# Patient Record
Sex: Male | Born: 1965 | Race: Black or African American | Hispanic: No | Marital: Single | Smoking: Current some day smoker
Health system: Southern US, Community
[De-identification: ages and names within clinical notes are randomized; demographics above are authoritative.]

## PROBLEM LIST (undated history)

## (undated) DIAGNOSIS — B1920 Unspecified viral hepatitis C without hepatic coma: Secondary | ICD-10-CM

## (undated) DIAGNOSIS — I10 Essential (primary) hypertension: Secondary | ICD-10-CM

## (undated) DIAGNOSIS — I729 Aneurysm of unspecified site: Secondary | ICD-10-CM

## (undated) DIAGNOSIS — E119 Type 2 diabetes mellitus without complications: Secondary | ICD-10-CM

---

## 2006-05-09 ENCOUNTER — Emergency Department: Payer: Self-pay | Admitting: Emergency Medicine

## 2006-05-19 ENCOUNTER — Emergency Department: Payer: Self-pay | Admitting: Emergency Medicine

## 2015-07-06 ENCOUNTER — Emergency Department
Admission: EM | Admit: 2015-07-06 | Discharge: 2015-07-06 | Discharge status: Absent without leave | Payer: Self-pay | Source: Home / Self Care

## 2016-02-26 ENCOUNTER — Emergency Department (HOSPITAL_COMMUNITY)
Admission: EM | Admit: 2016-02-26 | Discharge: 2016-02-26 | Disposition: A | Discharge status: Home / Self Care | Payer: Self-pay | Attending: Emergency Medicine | Admitting: Emergency Medicine

## 2016-02-26 ENCOUNTER — Encounter (HOSPITAL_COMMUNITY): Payer: Self-pay | Admitting: Emergency Medicine

## 2016-02-26 ENCOUNTER — Emergency Department (HOSPITAL_COMMUNITY): Payer: Self-pay

## 2016-02-26 DIAGNOSIS — F17210 Nicotine dependence, cigarettes, uncomplicated: Secondary | ICD-10-CM | POA: Insufficient documentation

## 2016-02-26 DIAGNOSIS — Z23 Encounter for immunization: Secondary | ICD-10-CM | POA: Insufficient documentation

## 2016-02-26 DIAGNOSIS — E119 Type 2 diabetes mellitus without complications: Secondary | ICD-10-CM | POA: Insufficient documentation

## 2016-02-26 DIAGNOSIS — S82041A Displaced comminuted fracture of right patella, initial encounter for closed fracture: Secondary | ICD-10-CM | POA: Insufficient documentation

## 2016-02-26 DIAGNOSIS — S82402A Unspecified fracture of shaft of left fibula, initial encounter for closed fracture: Secondary | ICD-10-CM

## 2016-02-26 DIAGNOSIS — S82432A Displaced oblique fracture of shaft of left fibula, initial encounter for closed fracture: Secondary | ICD-10-CM | POA: Insufficient documentation

## 2016-02-26 DIAGNOSIS — W1789XA Other fall from one level to another, initial encounter: Secondary | ICD-10-CM | POA: Insufficient documentation

## 2016-02-26 DIAGNOSIS — S82001A Unspecified fracture of right patella, initial encounter for closed fracture: Secondary | ICD-10-CM

## 2016-02-26 DIAGNOSIS — Z8619 Personal history of other infectious and parasitic diseases: Secondary | ICD-10-CM | POA: Insufficient documentation

## 2016-02-26 DIAGNOSIS — Z79899 Other long term (current) drug therapy: Secondary | ICD-10-CM | POA: Insufficient documentation

## 2016-02-26 DIAGNOSIS — Y9389 Activity, other specified: Secondary | ICD-10-CM | POA: Insufficient documentation

## 2016-02-26 DIAGNOSIS — Y998 Other external cause status: Secondary | ICD-10-CM | POA: Insufficient documentation

## 2016-02-26 DIAGNOSIS — S80212A Abrasion, left knee, initial encounter: Secondary | ICD-10-CM | POA: Insufficient documentation

## 2016-02-26 DIAGNOSIS — Z7984 Long term (current) use of oral hypoglycemic drugs: Secondary | ICD-10-CM | POA: Insufficient documentation

## 2016-02-26 DIAGNOSIS — I10 Essential (primary) hypertension: Secondary | ICD-10-CM | POA: Insufficient documentation

## 2016-02-26 DIAGNOSIS — Y92009 Unspecified place in unspecified non-institutional (private) residence as the place of occurrence of the external cause: Secondary | ICD-10-CM | POA: Insufficient documentation

## 2016-02-26 HISTORY — DX: Aneurysm of unspecified site: I72.9

## 2016-02-26 HISTORY — DX: Essential (primary) hypertension: I10

## 2016-02-26 HISTORY — DX: Type 2 diabetes mellitus without complications: E11.9

## 2016-02-26 HISTORY — DX: Unspecified viral hepatitis C without hepatic coma: B19.20

## 2016-02-26 MED ORDER — BACITRACIN ZINC 500 UNIT/GM EX OINT
TOPICAL_OINTMENT | Freq: Two times a day (BID) | CUTANEOUS | Status: DC
  Administered 2016-02-26: 1 via TOPICAL
  Filled 2016-02-26: qty 0.9

## 2016-02-26 MED ORDER — OXYCODONE-ACETAMINOPHEN 5-325 MG PO TABS: 1.0000 | ORAL_TABLET | Freq: Four times a day (QID) | ORAL | Status: DC | PRN

## 2016-02-26 MED ORDER — FENTANYL CITRATE (PF) 100 MCG/2ML IJ SOLN
50.0000 ug | Freq: Once | INTRAMUSCULAR | Status: AC
  Administered 2016-02-26: 50 ug via INTRAVENOUS
  Filled 2016-02-26: qty 2

## 2016-02-26 MED ORDER — TETANUS-DIPHTH-ACELL PERTUSSIS 5-2.5-18.5 LF-MCG/0.5 IM SUSP
0.5000 mL | Freq: Once | INTRAMUSCULAR | Status: AC
  Administered 2016-02-26: 0.5 mL via INTRAMUSCULAR
  Filled 2016-02-26: qty 0.5

## 2016-02-26 MED ORDER — KETOROLAC TROMETHAMINE 30 MG/ML IJ SOLN
30.0000 mg | Freq: Once | INTRAMUSCULAR | Status: AC
  Administered 2016-02-26: 30 mg via INTRAVENOUS
  Filled 2016-02-26: qty 1

## 2016-02-26 NOTE — ED Provider Notes (Signed)
CSN: [id number]     Arrival date & time 02/26/16  0044 History  By signing my name below, I, Max Brown, attest that this documentation has been prepared under the direction and in the presence of Max Lenderman, MD. Electronically Signed: Bethel BornBritney Brown, ED Scribe. 02/26/2016. 3:34 AM   Chief Complaint  Patient presents with  . Fall    Patient is a 50 y.o. male presenting with fall. The history is provided by the patient. No language interpreter was used.  Fall This is a new problem. The current episode started less than 1 hour ago. The problem occurs constantly. The problem has not changed since onset.Pertinent negatives include no chest pain, no abdominal pain, no headaches and no shortness of breath. Nothing aggravates the symptoms. Nothing relieves the symptoms. He has tried nothing for the symptoms.   Brought in by EMS, Max Brown is a 50 y.o. male who presents to the Emergency Department complaining of a fall at home just PTA. Pt states that he was sitting in a chair and fell 15 feet out of his open bedroom window. He landed on his feet and then his right knee. Associated symptoms include right knee pain and abrasions. Pt denies head injury, LOC, back pain, right hip pain, and left lower extremity pain. He was given fentanyl by EMS en route to the hospital. Last tetanus is unknown.  Past Medical History  Diagnosis Date  . Hepatitis C     pt states in 2009 diagnosed and received some meds for it, but has not gotten anymore  . Diabetes mellitus without complication (HCC)   . Hypertension   . Aneurysm (HCC)     left side of brain, 1997   History reviewed. No pertinent past surgical history. History reviewed. No pertinent family history. Social History  Substance Use Topics  . Smoking status: Current Every Day Smoker -- 0.20 packs/day for 30 years    Types: Cigarettes  . Smokeless tobacco: Never Used  . Alcohol Use: No    Review of Systems  Respiratory: Negative for  shortness of breath.   Cardiovascular: Negative for chest pain.  Gastrointestinal: Negative for abdominal pain.  Musculoskeletal: Negative for back pain and neck pain.       Right knee pain  Skin: Positive for wound (right knee).  Neurological: Negative for syncope and headaches.  All other systems reviewed and are negative.  Allergies  Review of patient's allergies indicates no known allergies.  Home Medications   Prior to Admission medications   Medication Sig Start Date End Date Taking? Authorizing Provider  atorvastatin (LIPITOR) 10 MG tablet Take 10 mg by mouth daily. 12/21/15  Yes Historical Provider, MD  metFORMIN (GLUCOPHAGE) 1000 MG tablet Take 1,000 mg by mouth 2 (two) times daily. 12/21/15  Yes Historical Provider, MD   BP 116/71 mmHg  Pulse 89  Temp(Src) 98.4 F (36.9 C) (Oral)  Resp 20  Ht 5\' 9"  (1.753 m)  Wt 186 lb (84.369 kg)  BMI 27.45 kg/m2  SpO2 98% Physical Exam  Constitutional: He is oriented to person, place, and time. He appears well-developed and well-nourished.  HENT:  Head: Normocephalic and atraumatic.  Mouth/Throat: Oropharynx is clear and moist.  Moist mucous membranes No exudate No battle sign No racoon eyes No hemotympanum bilaterally  Eyes: EOM are normal. Pupils are equal, round, and reactive to light.  Neck: Normal range of motion. Neck supple.  Trachea midline  Cardiovascular: Normal rate, regular rhythm and intact distal pulses.  Pulses:      Dorsalis pedis pulses are 3+ on the right side.       Posterior tibial pulses are 3+ on the right side.  Pulmonary/Chest: Effort normal and breath sounds normal. No stridor. He has no wheezes. He has no rales.  Abdominal: Soft. Bowel sounds are normal. He exhibits no mass. There is no tenderness. There is no rebound and no guarding.  Musculoskeletal:       Right wrist: Normal.       Left wrist: Normal.       Right hip: Normal.       Left hip: Normal.       Right knee: He exhibits decreased range  of motion and bony tenderness. He exhibits no laceration, normal alignment, normal meniscus and no MCL laxity. No tenderness found. No medial joint line, no lateral joint line, no MCL, no LCL and no patellar tendon tenderness noted.       Left ankle: Normal. Achilles tendon normal.  2 abrasions: 1X 1 cm in diameter lateral abrasion, second abrasion is linear and more diagonal Capillary refill less than 2 seconds Negative anterior/ posterior drawer test No patella [place] or [place] No laxity to varus or valgus stress  Neurological: He is alert and oriented to person, place, and time.  Achilles tendon intact  Skin: Skin is warm and dry.  Psychiatric: He has a normal mood and affect. His behavior is normal.  Nursing note and vitals reviewed.   ED Course  Procedures (including critical care time) DIAGNOSTIC STUDIES: Oxygen Saturation is 98% on RA,  normal by my interpretation.    COORDINATION OF CARE: 12:59 AM Discussed treatment plan which includes XRs of the right knee and hip with pt at bedside and pt agreed to plan.  1:09 AM Pt now complaining of left ankle pain and admitting to using crack cocaine prior to the fall. Plan for left ankle XR, left knee XR, XR of the lumbar spine, and CT head without contrast.   3:32 AM-Consult complete with Max Brown (Ortho). Patient case explained and discussed. Call ended at 3:33 AM   Labs Review Labs Reviewed - No data to display  Imaging Review Dg Lumbar Spine Complete  02/26/2016  CLINICAL DATA:  Fall from bedroom window. No reported back pain. Initial encounter. EXAM: LUMBAR SPINE - COMPLETE 4+ VIEW COMPARISON:  None. FINDINGS: There is no evidence of lumbar spine fracture. Alignment is normal. Intervertebral disc spaces are maintained. IMPRESSION: Negative. Electronically Signed   By: Marnee Spring M.D.   On: 02/26/2016 01:54   Dg Ankle Complete Left  02/26/2016  CLINICAL DATA:  Larey Seat 10 feet out of bedroom window, with diffuse left ankle pain.  Initial encounter. EXAM: LEFT ANKLE COMPLETE - 3+ VIEW COMPARISON:  None. FINDINGS: There is a minimally displaced oblique fracture through the distal fibula. A tiny osseous fragment is seen arising at the posterior malleolus. The ankle mortise is grossly unremarkable; the interosseous space is within normal limits. No talar tilt or subluxation is seen. The joint spaces are preserved. No definite soft tissue abnormalities are characterized on radiograph. IMPRESSION: Minimally displaced oblique fracture through the distal fibula. Tiny osseous fragment arising at the posterior malleolus. Electronically Signed   By: Roanna Raider M.D.   On: 02/26/2016 02:01   Ct Head Wo Contrast  02/26/2016  CLINICAL DATA:  Fall.  Initial encounter. EXAM: CT HEAD WITHOUT CONTRAST TECHNIQUE: Contiguous axial images were obtained from the base of the skull through the vertex  without intravenous contrast. COMPARISON:  None. FINDINGS: Skull and Sinuses:Negative for fracture or destructive process. The visualized mastoids, middle ears, and imaged paranasal sinuses are clear. Visualized orbits: Negative. Brain: No evidence of acute infarction, hemorrhage, hydrocephalus, or mass lesion/mass effect. There is well circumscribed encephalomalacia involving the right temporal lobe and the inferior/parasagittal right frontal lobe. Small cortical and subcortical gliosis in the high posterior right frontal lobe. This is likely from remote MCA and ACA territory infarcts; would expect hemorrhagic contusions to be bilateral with more inferior predominance. IMPRESSION: 1. No evidence of intracranial injury or fracture. 2. Remote right hemispheric infarcts. Electronically Signed   By: Marnee Spring M.D.   On: 02/26/2016 02:34   Dg Knee Complete 4 Views Left  02/26/2016  CLINICAL DATA:  Larey Seat 10 feet out of bedroom window, with left anterior knee pain. Initial encounter. EXAM: LEFT KNEE - COMPLETE 4+ VIEW COMPARISON:  None. FINDINGS: There is no  evidence of fracture or dislocation. An enthesophyte is seen arising at the upper pole of the patella. The joint spaces are preserved. No significant degenerative change is seen; the patellofemoral joint is grossly unremarkable in appearance. No significant joint effusion is seen. The visualized soft tissues are normal in appearance. IMPRESSION: No evidence of fracture or dislocation. Electronically Signed   By: Roanna Raider M.D.   On: 02/26/2016 02:02   Dg Knee Complete 4 Views Right  02/26/2016  CLINICAL DATA:  Status post fall 10 feet out of bedroom window, with right anterior knee pain and abrasion. Initial encounter. EXAM: RIGHT KNEE - COMPLETE 4+ VIEW COMPARISON:  None. FINDINGS: There is a significantly comminuted fracture of the patella, with up to 3.8 cm of distraction between proximal and distal fragments. Overlying soft tissue irregularity is noted. The joint spaces are preserved. No significant degenerative change is seen. No significant joint effusion is seen. The visualized soft tissues are normal in appearance. IMPRESSION: Significantly comminuted fracture of the patella, with up to 3.8 cm of distraction between proximal and distal fragments. Electronically Signed   By: Roanna Raider M.D.   On: 02/26/2016 01:56   Dg Hip Unilat With Pelvis 2-3 Views Right  02/26/2016  CLINICAL DATA:  Status post fall 10 feet out of bedroom window, with right anterior knee pain and abrasion. Initial encounter. EXAM: DG HIP (WITH OR WITHOUT PELVIS) 2-3V RIGHT COMPARISON:  None. FINDINGS: There is no evidence of fracture or dislocation. Mild osteophyte formation is noted about the right femoral head. Both femoral heads are seated normally within their respective acetabula. The proximal right femur appears intact. No significant degenerative change is appreciated. The sacroiliac joints are unremarkable in appearance. The visualized bowel gas pattern is grossly unremarkable in appearance. IMPRESSION: No evidence of  fracture or dislocation. Electronically Signed   By: Roanna Raider M.D.   On: 02/26/2016 01:58   I have personally reviewed and evaluated these images as part of my medical decision-making.   EKG Interpretation None      MDM   Final diagnoses:  None   Medications  bacitracin ointment (1 application Topical Given 02/26/16 0415)  fentaNYL (SUBLIMAZE) injection 50 mcg (50 mcg Intravenous Given 02/26/16 0144)  ketorolac (TORADOL) 30 MG/ML injection 30 mg (30 mg Intravenous Given 02/26/16 0327)  Tdap (BOOSTRIX) injection 0.5 mL (0.5 mLs Intramuscular Given 02/26/16 0414)   Case d/w Max Brown of orthopedics, cam walker on the left ankle and well padded right knee with immobilizer.  Crutches, no weight bearing on the left foot.  Call today to be seen in the office this week.   Cam walker placed knee well padded by ortho tech and immobilizer placed and crutches fitted.    Ambulated in hallway with crutches without assistance.      Ice and elevation when not ambulating.  Percocet 1 tab PO every 6 hours.  Call Max Brown this am to arrange follow up  I personally performed the services described in this documentation, which was scribed in my presence. The recorded information has been reviewed and is accurate.     Cy Blamer, MD 02/26/16 [phone]

## 2016-02-26 NOTE — ED Notes (Signed)
Patient transported to X-ray 

## 2016-02-26 NOTE — ED Notes (Signed)
[facility] Pt was sitting in his windowsill, lost balance and fell about 15 feet to ground, flipping during fall and landing with primary force on right leg. Pt has right knee abrasion and patella displacement. No neuro deficits in right leg, able to move toes. Last vitals 138/96, HR 110, 100% on RA. 20g placed in left hand and fentanyl given. Pt admits to relapsing around 1600 and taking 4-5 hits of crack.

## 2016-02-26 NOTE — Progress Notes (Signed)
Orthopedic Tech Progress Note Patient Details:  Max Brown 02-06-1966 [id number]  Ortho Devices Type of Ortho Device: CAM walker, Knee Immobilizer Ortho Device/Splint Location: applied cam to lle, webril padding under knee immobilizer to rle Ortho Device/Splint Interventions: Ordered, Application Applied padding under KI as per drs verbal order  Trinna PostMartinez, Alajiah Dutkiewicz J 02/26/2016, 4:33 AM

## 2016-02-26 NOTE — ED Notes (Signed)
Pt becoming aggressive at dc time, states he is homeless and he requires for the hospital or the police to take him home, pt oriented that he needs to call a family member or a friend to take him home or we can give him a bus pass, pt continues to be verbally and physically aggressive towards ED staff, security call, ED CN at the bedside.

## 2016-02-26 NOTE — Discharge Instructions (Signed)
°Cast or Splint Care  ° ° °Casts and splints support injured limbs and keep bones from moving while they heal. It is important to care for your cast or splint at home.  °HOME CARE INSTRUCTIONS  °Keep the cast or splint uncovered during the drying period. It can take 24 to 48 hours to dry if it is made of plaster. A fiberglass cast will dry in less than 1 hour.  °Do not rest the cast on anything harder than a pillow for the first 24 hours.  °Do not put weight on your injured limb or apply pressure to the cast until your health care provider gives you permission.  °Keep the cast or splint dry. Wet casts or splints can lose their shape and may not support the limb as well. A wet cast that has lost its shape can also create harmful pressure on your skin when it dries. Also, wet skin can become infected.  °Cover the cast or splint with a plastic bag when bathing or when out in the rain or snow. If the cast is on the trunk of the body, take sponge baths until the cast is removed.  °If your cast does become wet, dry it with a towel or a blow dryer on the cool setting only. °Keep your cast or splint clean. Soiled casts may be wiped with a moistened cloth.  °Do not place any hard or soft foreign objects under your cast or splint, such as cotton, toilet paper, lotion, or powder.  °Do not try to scratch the skin under the cast with any object. The object could get stuck inside the cast. Also, scratching could lead to an infection. If itching is a problem, use a blow dryer on a cool setting to relieve discomfort.  °Do not trim or cut your cast or remove padding from inside of it.  °Exercise all joints next to the injury that are not immobilized by the cast or splint. For example, if you have a long leg cast, exercise the hip joint and toes. If you have an arm cast or splint, exercise the shoulder, elbow, thumb, and fingers.  °Elevate your injured arm or leg on 1 or 2 pillows for the first 1 to 3 days to decrease swelling and  pain. It is best if you can comfortably elevate your cast so it is higher than your heart. °SEEK MEDICAL CARE IF:  °Your cast or splint cracks.  °Your cast or splint is too tight or too loose.  °You have unbearable itching inside the cast.  °Your cast becomes wet or develops a soft spot or area.  °You have a bad smell coming from inside your cast.  °You get an object stuck under your cast.  °Your skin around the cast becomes red or raw.  °You have new pain or worsening pain after the cast has been applied. °SEEK IMMEDIATE MEDICAL CARE IF:  °You have fluid leaking through the cast.  °You are unable to move your fingers or toes.  °You have discolored (blue or white), cool, painful, or very swollen fingers or toes beyond the cast.  °You have tingling or numbness around the injured area.  °You have severe pain or pressure under the cast.  °You have any difficulty with your breathing or have shortness of breath.  °You have chest pain. °This information is not intended to replace advice given to you by your health care provider. Make sure you discuss any questions you have with your health care provider.  °  Document Released: 11/29/2000 Document Revised: 09/22/2013 Document Reviewed: 06/10/2013  °Elsevier Interactive Patient Education ©2016 [employer]  ° °

## 2016-02-26 NOTE — ED Notes (Signed)
Patient transported to imaging.

## 2016-02-26 NOTE — ED Notes (Signed)
Ortho tech notified.  

## 2017-01-23 ENCOUNTER — Ambulatory Visit: Payer: Self-pay | Admitting: Urology

## 2017-01-23 VITALS — BP 136/86 | HR 72 | Temp 97.9°F | Wt 190.6 lb

## 2017-01-23 DIAGNOSIS — G8929 Other chronic pain: Secondary | ICD-10-CM

## 2017-01-23 DIAGNOSIS — M25561 Pain in right knee: Secondary | ICD-10-CM

## 2017-01-23 DIAGNOSIS — E119 Type 2 diabetes mellitus without complications: Secondary | ICD-10-CM

## 2017-01-23 LAB — GLUCOSE, POCT (MANUAL RESULT ENTRY): POC GLUCOSE: 142 mg/dL — AB (ref 70–99)

## 2017-01-23 MED ORDER — METFORMIN HCL 1000 MG PO TABS: 1000.0000 mg | ORAL_TABLET | Freq: Two times a day (BID) | ORAL | 3 refills | Status: DC

## 2017-01-23 MED ORDER — ATORVASTATIN CALCIUM 10 MG PO TABS: 10.0000 mg | ORAL_TABLET | Freq: Every day | ORAL | 3 refills | Status: DC

## 2017-01-23 MED ORDER — GABAPENTIN 300 MG PO CAPS: 300.0000 mg | ORAL_CAPSULE | Freq: Three times a day (TID) | ORAL | 0 refills | Status: DC

## 2017-01-23 NOTE — Progress Notes (Signed)
  Patient: Max Brown Male    DOB: 20-Jul-1966   51 y.o.   MRN: [id number] Visit Date: 01/23/2017  Today's Provider: [facility]   Chief Complaint  Patient presents with  . Annual Exam  . Leg Pain    right leg, stabbing pain, stiffness  . Blurred Vision    both eyes  . Diabetes   Subjective:    HPI Patient is a 51 year old African American male with diabetes who suffered a fracture in his right knee after falling out a window.   He underwent an ORIF right patella fracture, non operatively treated left lateral malleolus fracture in 02/2016.  He was to have PT after surgery, but he could not find transportation to his appointments.    He has noted a decrease in ROM.  He cannot ride a bicycle or sit in the back seat of a car.  He states his right knee is swollen.  This has been since the surgery.  He has been taking ibuprofen without relief  He was diagnosed with DM in 2005.  BS at home 120.  He has been having blurry vision for the last two weeks.  He is out of strips and lancets  No recent labs.  Needs refills on meds.       No Known Allergies Previous Medications   OXYCODONE-ACETAMINOPHEN (PERCOCET) 5-325 MG TABLET    Take 1 tablet by mouth every 6 (six) hours as needed.    Review of Systems  Eyes: Positive for visual disturbance. Negative for pain, discharge and itching.  Musculoskeletal: Positive for arthralgias and joint swelling.  All other systems reviewed and are negative.   Social History  Substance Use Topics  . Smoking status: Current Every Day Smoker    Packs/day: 0.20    Years: 30.00    Types: Cigarettes  . Smokeless tobacco: Never Used  . Alcohol use No   Objective:   BP 136/86   Pulse 72   Temp 97.9 F (36.6 C)   Wt 190 lb 9.6 oz (86.5 kg)   BMI 28.15 kg/m   Physical Exam Constitutional: Well nourished. Alert and oriented, No acute distress. HEENT: [place] AT, moist mucus membranes. Trachea midline, no masses. Cardiovascular: No  clubbing, cyanosis, or edema. Respiratory: Normal respiratory effort, no increased work of breathing. GI: Abdomen is soft, non tender, non distended, no abdominal masses.  Skin: No rashes, bruises or suspicious lesions. Lymph: No cervical or inguinal adenopathy. Neurologic: Grossly intact, no focal deficits, moving all 4 extremities. Psychiatric: Normal mood and affect.       Assessment & Plan:     1. Chronic right knee pain  - s/p right patella fracture  - could not make PT appointment  - start gabapentin 300 mg tid  - schedule an appointment with Dr. Justice RocherFossier  - refer to [facility]  2. DM  - meds refilled  - check cbc, tsh, hbga1c, cmp, homocystine, psa, microalbumin and lipids  RTC in one month to review labs      [facility]   [facility]

## 2017-01-24 LAB — CBC WITH DIFFERENTIAL/PLATELET
BASOS ABS: 0.1 10*3/uL (ref 0.0–0.2)
Basos: 1 %
EOS (ABSOLUTE): 0.2 10*3/uL (ref 0.0–0.4)
Eos: 3 %
Hematocrit: 41.1 % (ref 37.5–51.0)
Hemoglobin: 13.8 g/dL (ref 13.0–17.7)
IMMATURE GRANS (ABS): 0 10*3/uL (ref 0.0–0.1)
Immature Granulocytes: 0 %
LYMPHS: 40 %
Lymphocytes Absolute: 2.6 10*3/uL (ref 0.7–3.1)
MCH: 29.7 pg (ref 26.6–33.0)
MCHC: 33.6 g/dL (ref 31.5–35.7)
MCV: 88 fL (ref 79–97)
Monocytes Absolute: 0.4 10*3/uL (ref 0.1–0.9)
Monocytes: 7 %
NEUTROS ABS: 3.2 10*3/uL (ref 1.4–7.0)
NEUTROS PCT: 49 %
Platelets: 215 10*3/uL (ref 150–379)
RBC: 4.65 x10E6/uL (ref 4.14–5.80)
RDW: 13.9 % (ref 12.3–15.4)
WBC: 6.5 10*3/uL (ref 3.4–10.8)

## 2017-01-24 LAB — TSH: TSH: 1.44 u[IU]/mL (ref 0.450–4.500)

## 2017-01-24 LAB — MICROALBUMIN / CREATININE URINE RATIO
Creatinine, Urine: 122.8 mg/dL
MICROALB/CREAT RATIO: 658 mg/g{creat} — AB (ref 0.0–30.0)
MICROALBUM., U, RANDOM: 808 ug/mL

## 2017-01-24 LAB — COMPREHENSIVE METABOLIC PANEL
ALK PHOS: 128 IU/L — AB (ref 39–117)
ALT: 22 IU/L (ref 0–44)
AST: 17 IU/L (ref 0–40)
Albumin/Globulin Ratio: 1.3 (ref 1.2–2.2)
Albumin: 4.1 g/dL (ref 3.5–5.5)
BILIRUBIN TOTAL: 0.2 mg/dL (ref 0.0–1.2)
BUN/Creatinine Ratio: 17 (ref 9–20)
BUN: 15 mg/dL (ref 6–24)
CHLORIDE: 103 mmol/L (ref 96–106)
CO2: 26 mmol/L (ref 18–29)
Calcium: 9.6 mg/dL (ref 8.7–10.2)
Creatinine, Ser: 0.87 mg/dL (ref 0.76–1.27)
GFR calc Af Amer: 115 mL/min/{1.73_m2} (ref >59)
GFR calc non Af Amer: 99 mL/min/{1.73_m2} (ref >59)
GLUCOSE: 115 mg/dL — AB (ref 65–99)
Globulin, Total: 3.1 g/dL (ref 1.5–4.5)
Potassium: 4.7 mmol/L (ref 3.5–5.2)
Sodium: 141 mmol/L (ref 134–144)
TOTAL PROTEIN: 7.2 g/dL (ref 6.0–8.5)

## 2017-01-24 LAB — LIPID PANEL
CHOLESTEROL TOTAL: 186 mg/dL (ref 100–199)
Chol/HDL Ratio: 4.7 ratio units (ref 0.0–5.0)
HDL: 40 mg/dL (ref >39)
LDL Calculated: 127 mg/dL — ABNORMAL HIGH (ref 0–99)
Triglycerides: 97 mg/dL (ref 0–149)
VLDL Cholesterol Cal: 19 mg/dL (ref 5–40)

## 2017-01-24 LAB — HEMOGLOBIN A1C
Est. average glucose Bld gHb Est-mCnc: 128 mg/dL
HEMOGLOBIN A1C: 6.1 % — AB (ref 4.8–5.6)

## 2017-01-24 LAB — PSA: PROSTATE SPECIFIC AG, SERUM: 0.8 ng/mL (ref 0.0–4.0)

## 2017-01-24 LAB — HOMOCYSTEINE: HOMOCYSTEINE: 10.5 umol/L (ref 0.0–15.0)

## 2017-01-27 ENCOUNTER — Emergency Department: Payer: Self-pay

## 2017-01-27 ENCOUNTER — Emergency Department
Admission: EM | Admit: 2017-01-27 | Discharge: 2017-01-27 | Disposition: A | Discharge status: Home / Self Care | Payer: Self-pay | Attending: Emergency Medicine | Admitting: Emergency Medicine

## 2017-01-27 ENCOUNTER — Encounter: Payer: Self-pay | Admitting: Emergency Medicine

## 2017-01-27 DIAGNOSIS — Y929 Unspecified place or not applicable: Secondary | ICD-10-CM | POA: Insufficient documentation

## 2017-01-27 DIAGNOSIS — W19XXXA Unspecified fall, initial encounter: Secondary | ICD-10-CM

## 2017-01-27 DIAGNOSIS — Z79899 Other long term (current) drug therapy: Secondary | ICD-10-CM | POA: Insufficient documentation

## 2017-01-27 DIAGNOSIS — I10 Essential (primary) hypertension: Secondary | ICD-10-CM | POA: Insufficient documentation

## 2017-01-27 DIAGNOSIS — Y939 Activity, unspecified: Secondary | ICD-10-CM | POA: Insufficient documentation

## 2017-01-27 DIAGNOSIS — W108XXA Fall (on) (from) other stairs and steps, initial encounter: Secondary | ICD-10-CM | POA: Insufficient documentation

## 2017-01-27 DIAGNOSIS — S298XXA Other specified injuries of thorax, initial encounter: Secondary | ICD-10-CM

## 2017-01-27 DIAGNOSIS — S20212A Contusion of left front wall of thorax, initial encounter: Secondary | ICD-10-CM | POA: Insufficient documentation

## 2017-01-27 DIAGNOSIS — Z7984 Long term (current) use of oral hypoglycemic drugs: Secondary | ICD-10-CM | POA: Insufficient documentation

## 2017-01-27 DIAGNOSIS — Y999 Unspecified external cause status: Secondary | ICD-10-CM | POA: Insufficient documentation

## 2017-01-27 DIAGNOSIS — F17210 Nicotine dependence, cigarettes, uncomplicated: Secondary | ICD-10-CM | POA: Insufficient documentation

## 2017-01-27 DIAGNOSIS — E119 Type 2 diabetes mellitus without complications: Secondary | ICD-10-CM | POA: Insufficient documentation

## 2017-01-27 MED ORDER — IBUPROFEN 600 MG PO TABS: 600.0000 mg | ORAL_TABLET | Freq: Three times a day (TID) | ORAL | 0 refills | Status: DC | PRN

## 2017-01-27 NOTE — ED Notes (Signed)
See triage note  States he slipped down the last 3 steps last pm  Hit his mid back on metal step  Having pain to mid back  Ambulates well w/o limp

## 2017-01-27 NOTE — ED Provider Notes (Signed)
[facility] Emergency Department Provider Note   ____________________________________________   First MD Initiated Contact with Patient 01/27/17 [phone]     (approximate)  I have reviewed the triage vital signs and the nursing notes.   HISTORY  Chief Complaint Fall    HPI Max Brown is a 51 y.o. male with complaint of left back pain after falling on some metal steps backwards last evening. Patient states he was carrying a mop bucket when he fell backwards at [facility] where he has been a patient since 1/19. Patient denies hitting his head or any loss of consciousness. He denies any difficulty with breathing. He denies any previous injury to his back. Denies any nausea or vomiting.   Past Medical History:  Diagnosis Date  . Aneurysm (HCC)    left side of brain, 1997  . Diabetes mellitus without complication (HCC)   . Hepatitis C    pt states in 2009 diagnosed and received some meds for it, but has not gotten anymore  . Hypertension     There are no active problems to display for this patient.   No past surgical history on file.  Prior to Admission medications   Medication Sig Start Date End Date Taking? Authorizing Provider  atorvastatin (LIPITOR) 10 MG tablet Take 1 tablet (10 mg total) by mouth daily. 01/23/17   Harle BattiestShannon A McGowan, PA-C  ibuprofen (ADVIL,MOTRIN) 600 MG tablet Take 1 tablet (600 mg total) by mouth every 8 (eight) hours as needed. 01/27/17   Tommi Rumpshonda L Summers, PA-C  metFORMIN (GLUCOPHAGE) 1000 MG tablet Take 1 tablet (1,000 mg total) by mouth 2 (two) times daily. 01/23/17   Harle BattiestShannon A McGowan, PA-C    Allergies Patient has no known allergies.  No family history on file.  Social History Social History  Substance Use Topics  . Smoking status: Current Every Day Smoker    Packs/day: 0.20    Years: 30.00    Types: Cigarettes  . Smokeless tobacco: Never Used  . Alcohol use No    Review of Systems Constitutional: No  fever/chills Cardiovascular: Denies chest pain. Respiratory: Denies shortness of breath. Gastrointestinal: No abdominal pain.  No nausea, no vomiting.  Musculoskeletal: Positive for left-sided back pain. Skin: Negative for rash. Neurological: Negative for headaches, focal weakness or numbness. Endocrine:Positive for hypertension. Positive for diabetes mellitus.  10-point ROS otherwise negative.  ____________________________________________   PHYSICAL EXAM:  VITAL SIGNS: ED Triage Vitals  Enc Vitals Group     BP 01/27/17 0817 (!) 156/86     Pulse Rate 01/27/17 0817 87     Resp 01/27/17 0817 18     Temp 01/27/17 0817 98.4 F (36.9 C)     Temp Source 01/27/17 0817 Oral     SpO2 01/27/17 0817 100 %     Weight 01/27/17 0816 190 lb (86.2 kg)     Height 01/27/17 0816 5\' 9"  (1.753 m)     Head Circumference --      Peak Flow --      Pain Score 01/27/17 0816 5     Pain Loc --      Pain Edu? --      Excl. in [place]? --     Constitutional: Alert and oriented. Well appearing and in no acute distress. Eyes: Conjunctivae are normal. PERRL. EOMI. Head: Atraumatic. Nose: No congestion/rhinnorhea. Mouth/Throat: Mucous membranes are moist.  Oropharynx non-erythematous. Neck: No stridor. Nontender cervical spine on palpation posteriorly. Cardiovascular: Normal rate, regular rhythm. Grossly normal heart  sounds.  Good peripheral circulation. Respiratory: Normal respiratory effort.  No retractions. Lungs CTAB. Gastrointestinal: Soft and nontender. No distention.  Musculoskeletal: Examination back there is no gross deformity noted. There is no tenderness on palpation of the thoracic or lumbar spine vertebral bodies however there is tenderness on palpation of the left lower ribs posteriorly. No soft tissue swellings present. No ecchymosis or abrasions seen. Range of motion reproduces pain. Neurologic:  Normal speech and language. No gross focal neurologic deficits are appreciated.  Skin:  Skin is  warm, dry and intact. No abrasions, ecchymosis or erythema was noted. Psychiatric: Mood and affect are normal. Speech and behavior are normal.  ____________________________________________   LABS (all labs ordered are listed, but only abnormal results are displayed)  Labs Reviewed - No data to display  RADIOLOGY  Left rib x-ray per radiologist: Question nondisplaced fractureversus superimposed stool artifact at  the posterolateral LEFT tenth rib.  Questionable fractures of the LEFT tenth rib as above AP Chest was done per radiology request.  IMPRESSION:  No infiltrate or pulmonary edema. No evidence of lung nodule.  Bilateral nipple markers are noted.    ____________________________________________   PROCEDURES  Procedure(s) performed: None  Procedures  Critical Care performed: No  ____________________________________________   INITIAL IMPRESSION / ASSESSMENT AND PLAN / ED COURSE  Pertinent labs & imaging results that were available during my care of the patient were reviewed by me and considered in my medical decision making (see chart for details).  Patient was made aware of his x-ray findings. He states that there are to [place] if he has a prescription for ibuprofen he can take it while he is there. Prescription for ibuprofen 600 mg 3 times a day with food was given to him along with instructions to use ice to the area if needed for pain. He is to follow-up with his PCP if any continued problems.      ____________________________________________   FINAL CLINICAL IMPRESSION(S) / ED DIAGNOSES  Final diagnoses:  Contusion of ribs, left, initial encounter  Fall, initial encounter      NEW MEDICATIONS STARTED DURING THIS VISIT:  Discharge Medication List as of 01/27/2017 10:29 AM    START taking these medications   Details  ibuprofen (ADVIL,MOTRIN) 600 MG tablet Take 1 tablet (600 mg total) by mouth every 8 (eight) hours as needed., Starting Mon 01/27/2017,  Print         Note:  This document was prepared using Dragon voice recognition software and may include unintentional dictation errors.    Tommi Rumps, PA-C 01/27/17 1102    Sharyn Creamer, MD 01/27/17 1535

## 2017-01-27 NOTE — Discharge Instructions (Signed)
Follow-up with your primary care doctor if any continued problems. Begin taking ibuprofen 600 mg 3 times a day with food as needed for pain. He may also use ice to the area as needed.

## 2017-01-27 NOTE — ED Triage Notes (Addendum)
Pt here from [facility] (has been a patient there since 1/19), reports he fell down the stairs last night carrying a mop bucket; denies hitting head or LOC. Pt reports continued pain to left hip. Pt ambulatory to triage desk.

## 2017-01-29 ENCOUNTER — Encounter: Payer: Self-pay | Admitting: Specialist

## 2017-01-31 ENCOUNTER — Telehealth: Payer: Self-pay

## 2017-01-31 ENCOUNTER — Other Ambulatory Visit: Payer: Self-pay | Admitting: Urology

## 2017-01-31 MED ORDER — LISINOPRIL 10 MG PO TABS: 10.0000 mg | ORAL_TABLET | Freq: Every day | ORAL | 0 refills | Status: DC

## 2017-01-31 NOTE — Telephone Encounter (Signed)
-----   Message from Harle BattiestShannon A McGowan, PA-C sent at 01/31/2017  8:41 AM EST ----- Please notify the patient that he is spilling protein into his urine.  We need to start lisinopril 10 mg at this time to protect his kidney.

## 2017-01-31 NOTE — Telephone Encounter (Signed)
Called pt with results. Appts made. Pt verbalized understanding.

## 2017-01-31 NOTE — Progress Notes (Unsigned)
I have sent in a [facility].  We need to check his BP in 2 weeks.  He also needs a follow up appointment in 4 months.  We need a HbgA1c and a BMP prior to the appointment.

## 2017-02-10 ENCOUNTER — Ambulatory Visit: Payer: Self-pay | Admitting: Pharmacy Technician

## 2017-02-10 ENCOUNTER — Other Ambulatory Visit: Payer: Self-pay | Admitting: Urology

## 2017-02-10 DIAGNOSIS — Z79899 Other long term (current) drug therapy: Secondary | ICD-10-CM

## 2017-02-10 NOTE — Progress Notes (Signed)
Met with patient completed financial assistance application for [place] due to recent ED visit.  Patient agreed to be responsible for gathering financial information and forwarding to appropriate department in [facility].    Completed [facility] application and contract.  Patient agreed to all terms of the [facility] contract.  Provided patient with Civil engineer, contracting based on his particular needs.   Patient approved to receive medication assistance with [facility] through December 15, 2017, as long as patient's income does not exceed 250% FPL or pt does not acquire prescription coverage.   [facility]

## 2017-02-12 ENCOUNTER — Encounter: Payer: Self-pay | Admitting: Specialist

## 2017-02-18 ENCOUNTER — Ambulatory Visit: Payer: Self-pay | Admitting: Urology

## 2017-02-18 VITALS — BP 135/78

## 2017-02-18 DIAGNOSIS — Z0130 Encounter for examination of blood pressure without abnormal findings: Secondary | ICD-10-CM

## 2017-02-18 NOTE — Progress Notes (Unsigned)
No complaints

## 2017-02-20 ENCOUNTER — Ambulatory Visit: Payer: Self-pay | Admitting: Ophthalmology

## 2017-03-13 ENCOUNTER — Ambulatory Visit: Payer: Self-pay | Admitting: Ophthalmology

## 2017-05-21 ENCOUNTER — Other Ambulatory Visit: Payer: Self-pay

## 2017-05-28 ENCOUNTER — Ambulatory Visit: Payer: Self-pay | Admitting: Internal Medicine

## 2017-06-02 ENCOUNTER — Other Ambulatory Visit: Payer: Self-pay | Admitting: Urology

## 2017-06-04 ENCOUNTER — Encounter: Payer: Self-pay | Admitting: Internal Medicine

## 2017-06-04 ENCOUNTER — Ambulatory Visit: Payer: Self-pay | Admitting: Internal Medicine

## 2017-06-04 VITALS — BP 133/83 | HR 65 | Temp 98.4°F | Wt 188.9 lb

## 2017-06-04 DIAGNOSIS — E119 Type 2 diabetes mellitus without complications: Secondary | ICD-10-CM

## 2017-06-04 LAB — POCT GLUCOSE (DEVICE FOR HOME USE): GLUCOSE FASTING, POC: 266 mg/dL — AB (ref 70–99)

## 2017-06-04 MED ORDER — GABAPENTIN 300 MG PO CAPS: 300.0000 mg | ORAL_CAPSULE | Freq: Three times a day (TID) | ORAL | 3 refills | Status: DC

## 2017-06-04 MED ORDER — LISINOPRIL 10 MG PO TABS: 10.0000 mg | ORAL_TABLET | Freq: Every day | ORAL | 3 refills | Status: DC

## 2017-06-04 MED ORDER — IBUPROFEN 600 MG PO TABS: 600.0000 mg | ORAL_TABLET | Freq: Three times a day (TID) | ORAL | 3 refills | Status: DC | PRN

## 2017-06-04 MED ORDER — METFORMIN HCL 1000 MG PO TABS: 1000.0000 mg | ORAL_TABLET | Freq: Two times a day (BID) | ORAL | 3 refills | Status: DC

## 2017-06-04 MED ORDER — ATORVASTATIN CALCIUM 10 MG PO TABS: 10.0000 mg | ORAL_TABLET | Freq: Every day | ORAL | 3 refills | Status: DC

## 2017-06-04 NOTE — Patient Instructions (Signed)
Pt f/u in 3 months w/ labs

## 2017-06-04 NOTE — Progress Notes (Signed)
   Subjective:    Patient ID: Max Brown, male    DOB: 02-12-1966, 51 y.o.   MRN: [id number]  HPI   Pt is here for a follow up  There are no active problems to display for this patient.  Allergies as of 06/04/2017   No Known Allergies     Medication List       Accurate as of 06/04/17  9:30 AM. Always use your most recent med list.          atorvastatin 10 MG tablet Commonly known as:  LIPITOR Take 1 tablet (10 mg total) by mouth daily.   gabapentin 300 MG capsule Commonly known as:  NEURONTIN TAKE ONE CAPSULE BY MOUTH 3 TIMES A DAY   ibuprofen 600 MG tablet Commonly known as:  ADVIL,MOTRIN Take 1 tablet (600 mg total) by mouth every 8 (eight) hours as needed.   lisinopril 10 MG tablet Commonly known as:  PRINIVIL Take 1 tablet (10 mg total) by mouth daily.   metFORMIN 1000 MG tablet Commonly known as:  GLUCOPHAGE Take 1 tablet (1,000 mg total) by mouth 2 (two) times daily.          Review of Systems     Objective:   Physical Exam  Constitutional: He is oriented to person, place, and time.  Cardiovascular: Normal rate, regular rhythm and normal heart sounds.   Pulmonary/Chest: Effort normal and breath sounds normal.  Neurological: He is alert and oriented to person, place, and time.      BP 133/83   Pulse 65   Temp 98.4 F (36.9 C) (Oral)   Wt 188 lb 14.4 oz (85.7 kg)   BMI 27.90 kg/m        Assessment & Plan:   Pt needs glucometer  Labs: Met C, CBC, UA, A1C, Lipid, PSA, Microurine Albumin   Follow up in 3 months with labs: Met C, CBC, A1C

## 2017-06-05 LAB — CBC
HEMATOCRIT: 43.2 % (ref 37.5–51.0)
HEMOGLOBIN: 14.3 g/dL (ref 13.0–17.7)
MCH: 29.2 pg (ref 26.6–33.0)
MCHC: 33.1 g/dL (ref 31.5–35.7)
MCV: 88 fL (ref 79–97)
Platelets: 179 10*3/uL (ref 150–379)
RBC: 4.89 x10E6/uL (ref 4.14–5.80)
RDW: 13.3 % (ref 12.3–15.4)
WBC: 4.6 10*3/uL (ref 3.4–10.8)

## 2017-06-05 LAB — URINALYSIS
Bilirubin, UA: NEGATIVE
Ketones, UA: NEGATIVE
LEUKOCYTES UA: NEGATIVE
NITRITE UA: NEGATIVE
PH UA: 5 (ref 5.0–7.5)
RBC, UA: NEGATIVE
Specific Gravity, UA: >=1.03 — AB (ref 1.005–1.030)
Urobilinogen, Ur: 0.2 mg/dL (ref 0.2–1.0)

## 2017-06-05 LAB — COMPREHENSIVE METABOLIC PANEL
A/G RATIO: 1.4 (ref 1.2–2.2)
ALK PHOS: 151 IU/L — AB (ref 39–117)
ALT: 24 IU/L (ref 0–44)
AST: 14 IU/L (ref 0–40)
Albumin: 3.8 g/dL (ref 3.5–5.5)
BILIRUBIN TOTAL: 0.4 mg/dL (ref 0.0–1.2)
BUN / CREAT RATIO: 16 (ref 9–20)
BUN: 12 mg/dL (ref 6–24)
CHLORIDE: 109 mmol/L — AB (ref 96–106)
CO2: 19 mmol/L — AB (ref 20–29)
CREATININE: 0.74 mg/dL — AB (ref 0.76–1.27)
Calcium: 9 mg/dL (ref 8.7–10.2)
GFR calc Af Amer: 123 mL/min/{1.73_m2} (ref >59)
GFR calc non Af Amer: 106 mL/min/{1.73_m2} (ref >59)
GLOBULIN, TOTAL: 2.8 g/dL (ref 1.5–4.5)
Glucose: 315 mg/dL — ABNORMAL HIGH (ref 65–99)
POTASSIUM: 4.6 mmol/L (ref 3.5–5.2)
SODIUM: 144 mmol/L (ref 134–144)
Total Protein: 6.6 g/dL (ref 6.0–8.5)

## 2017-06-05 LAB — LIPID PANEL
CHOL/HDL RATIO: 4.1 ratio (ref 0.0–5.0)
CHOLESTEROL TOTAL: 182 mg/dL (ref 100–199)
HDL: 44 mg/dL (ref >39)
LDL CALC: 119 mg/dL — AB (ref 0–99)
TRIGLYCERIDES: 94 mg/dL (ref 0–149)
VLDL Cholesterol Cal: 19 mg/dL (ref 5–40)

## 2017-06-05 LAB — HEMOGLOBIN A1C
Est. average glucose Bld gHb Est-mCnc: 154 mg/dL
Hgb A1c MFr Bld: 7 % — ABNORMAL HIGH (ref 4.8–5.6)

## 2017-06-05 LAB — MICROALBUMIN / CREATININE URINE RATIO
Creatinine, Urine: 134.3 mg/dL
MICROALB/CREAT RATIO: 1186.5 mg/g{creat} — AB (ref 0.0–30.0)
MICROALBUM., U, RANDOM: 1593.5 ug/mL

## 2017-06-05 LAB — PSA: Prostate Specific Ag, Serum: 0.6 ng/mL (ref 0.0–4.0)

## 2017-06-13 ENCOUNTER — Telehealth: Payer: Self-pay

## 2017-06-13 NOTE — Telephone Encounter (Signed)
Tried to call patient concerning lab results no answer.

## 2017-06-20 IMAGING — CR DG HIP (WITH OR WITHOUT PELVIS) 2-3V*R*
2 series · 3 of 3 positions shown · non-contrast
Comparison: None.

CLINICAL DATA: Status post fall 10 feet out of bedroom window, with
right anterior knee pain and abrasion. Initial encounter.

EXAM:
DG HIP (WITH OR WITHOUT PELVIS) 2-3V RIGHT

[Series 1: pelvis ap · 0.14mm/px · 2 of 2 slices shown]
[im 1/2]
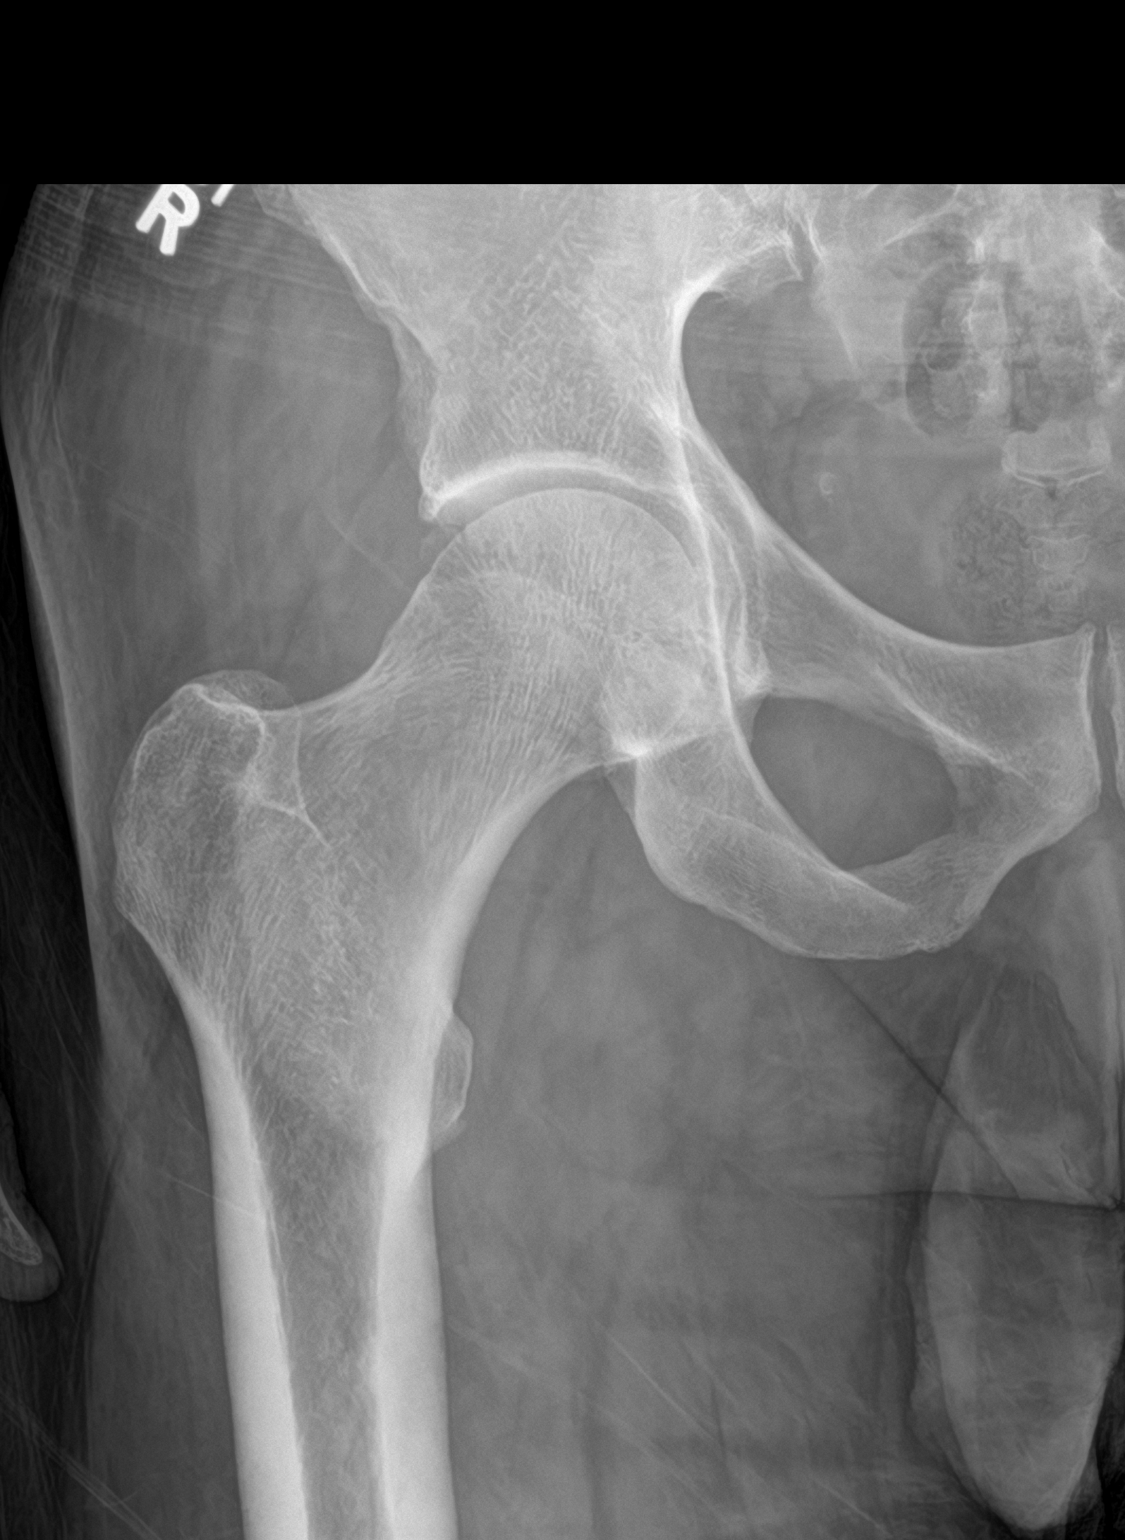
[im 2/2]
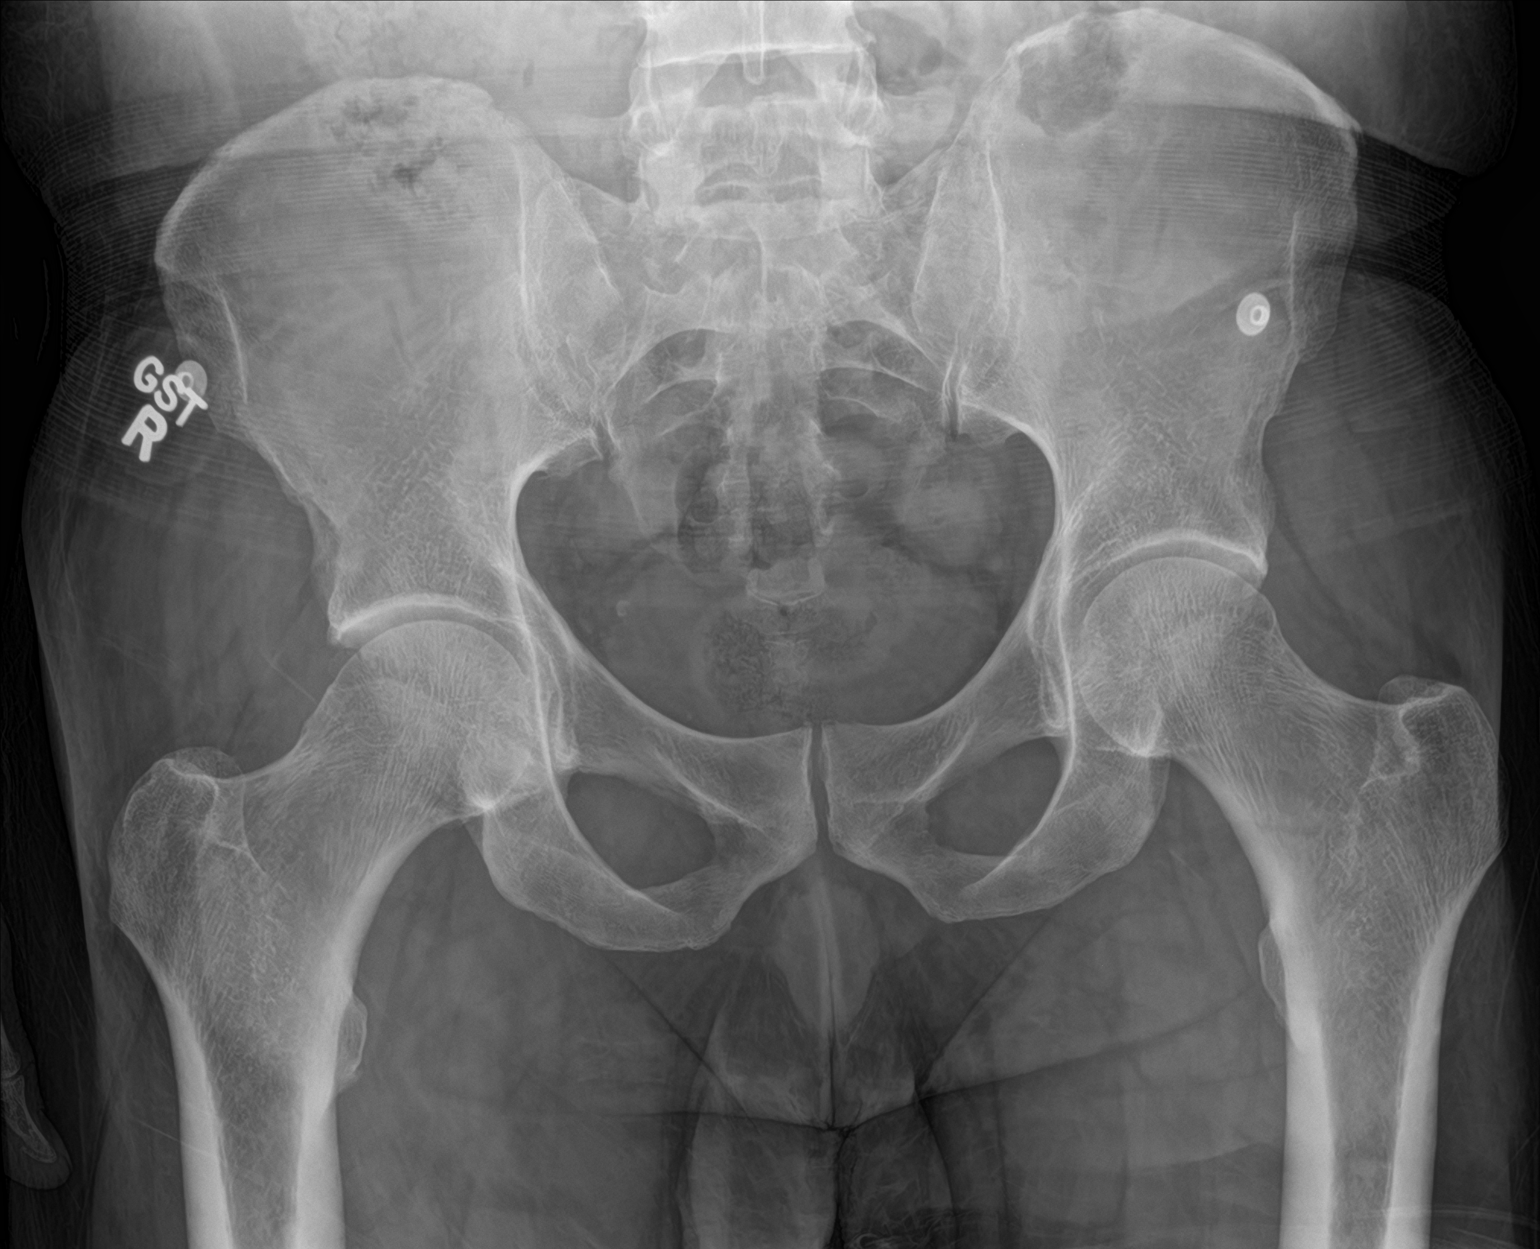

[hip lat]
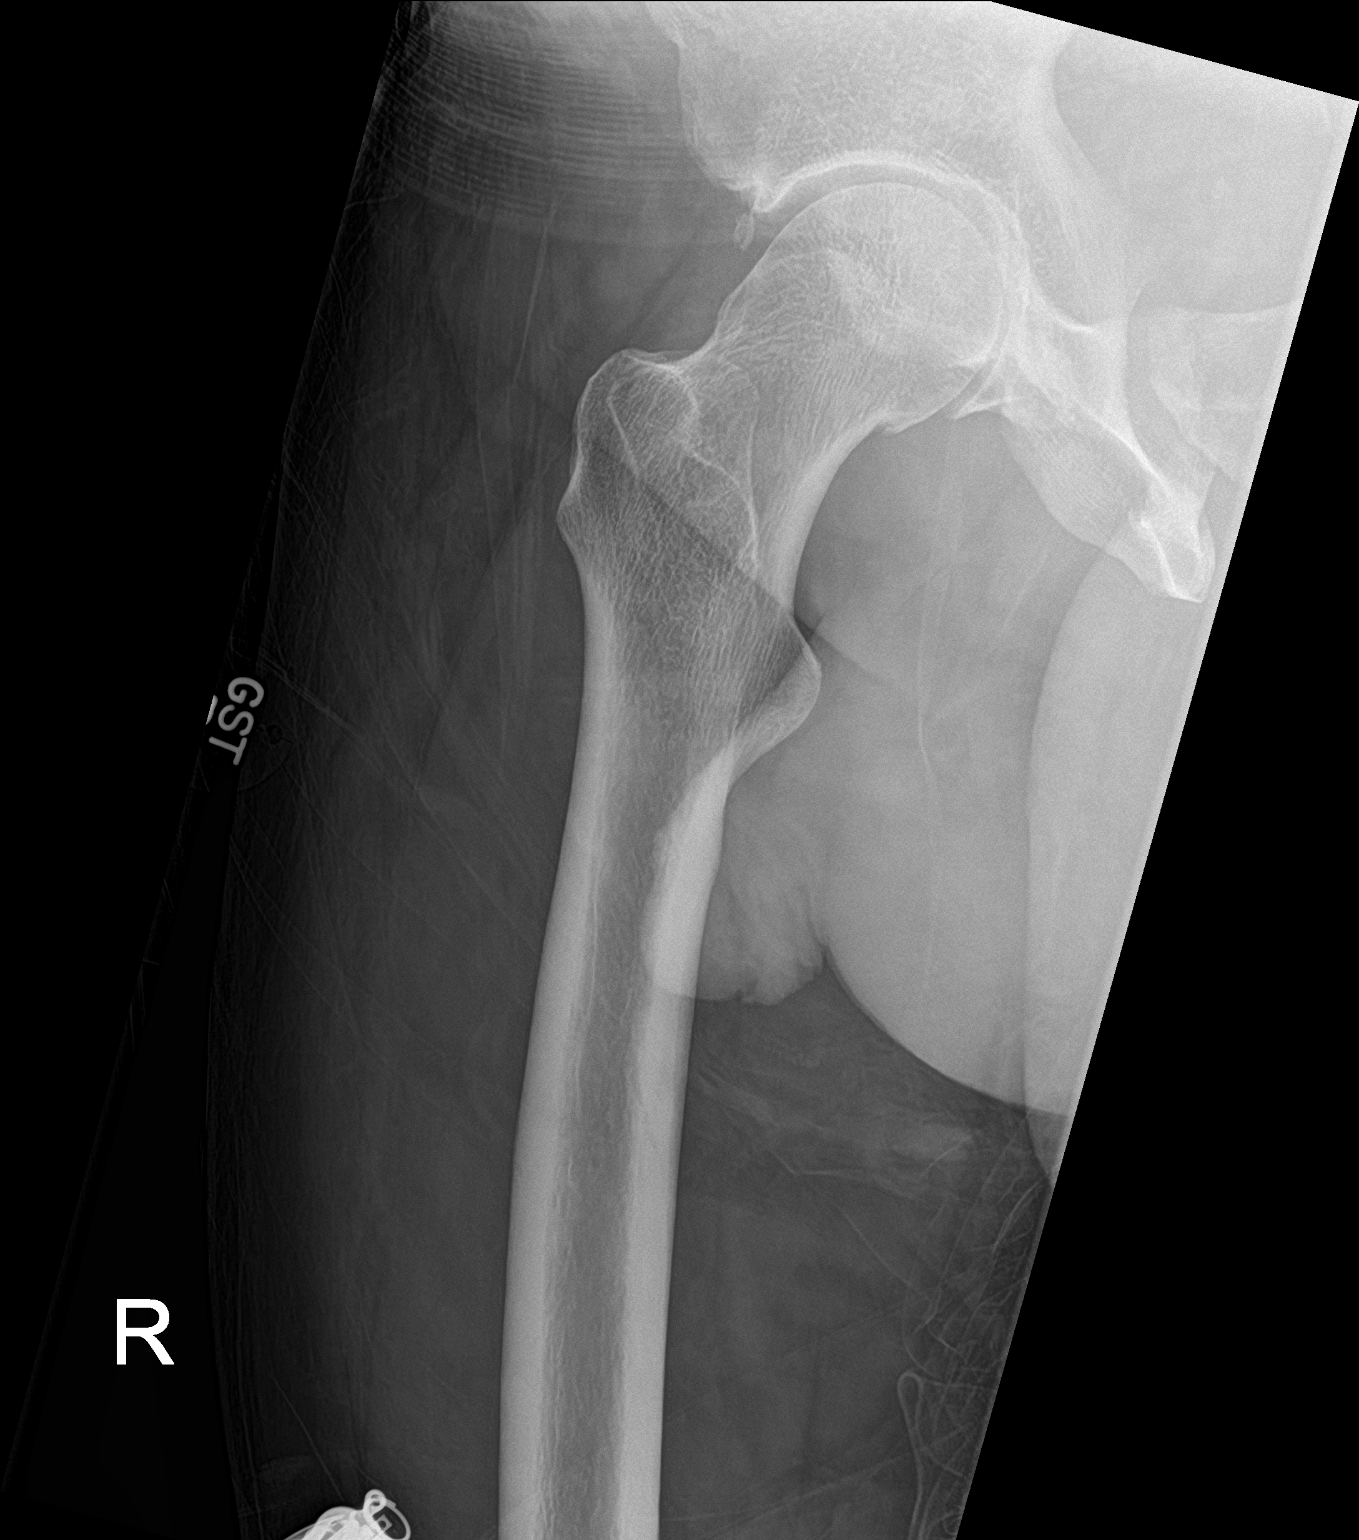

[3 of 3 positions shown; findings below may reference images not displayed]

FINDINGS: There is no evidence of fracture or dislocation. Mild osteophyte
formation is noted about the right femoral head. Both femoral heads
are seated normally within their respective acetabula. The proximal
right femur appears intact. No significant degenerative change is
appreciated. The sacroiliac joints are unremarkable in appearance.

The visualized bowel gas pattern is grossly unremarkable in
appearance.
IMPRESSION: No evidence of fracture or dislocation.

## 2017-07-15 ENCOUNTER — Telehealth: Payer: Self-pay

## 2017-07-15 NOTE — Telephone Encounter (Signed)
Patient called and asked for us to call the shelter and let him know when his future appointments are.  I called shelter LVM for patient.

## 2017-09-09 ENCOUNTER — Telehealth: Payer: Self-pay | Admitting: Nurse Practitioner

## 2017-09-09 NOTE — Telephone Encounter (Signed)
Wants to reschedule appt from 9/26. Call back

## 2017-09-10 ENCOUNTER — Other Ambulatory Visit: Payer: Self-pay

## 2017-09-17 ENCOUNTER — Ambulatory Visit: Payer: Self-pay | Admitting: Internal Medicine

## 2017-10-24 ENCOUNTER — Telehealth: Payer: Self-pay

## 2017-10-24 NOTE — Telephone Encounter (Signed)
Pt sent letter in mail asking for his lab and doc appt to be rescheduled. I rescheduled and sent the appts back to him in the mail as that is what the letter stated for me to do.

## 2017-11-05 ENCOUNTER — Other Ambulatory Visit: Payer: Self-pay

## 2017-11-05 DIAGNOSIS — Z0000 Encounter for general adult medical examination without abnormal findings: Secondary | ICD-10-CM

## 2017-11-05 DIAGNOSIS — E119 Type 2 diabetes mellitus without complications: Secondary | ICD-10-CM

## 2017-11-06 LAB — COMPREHENSIVE METABOLIC PANEL
A/G RATIO: 1.8 (ref 1.2–2.2)
ALBUMIN: 4.1 g/dL (ref 3.5–5.5)
ALT: 11 IU/L (ref 0–44)
AST: 11 IU/L (ref 0–40)
Alkaline Phosphatase: 126 IU/L — ABNORMAL HIGH (ref 39–117)
BUN / CREAT RATIO: 14 (ref 9–20)
BUN: 11 mg/dL (ref 6–24)
Bilirubin Total: 0.5 mg/dL (ref 0.0–1.2)
CALCIUM: 9 mg/dL (ref 8.7–10.2)
CO2: 22 mmol/L (ref 20–29)
CREATININE: 0.81 mg/dL (ref 0.76–1.27)
Chloride: 109 mmol/L — ABNORMAL HIGH (ref 96–106)
GFR, EST AFRICAN AMERICAN: 117 mL/min/{1.73_m2} (ref >59)
GFR, EST NON AFRICAN AMERICAN: 101 mL/min/{1.73_m2} (ref >59)
GLOBULIN, TOTAL: 2.3 g/dL (ref 1.5–4.5)
Glucose: 143 mg/dL — ABNORMAL HIGH (ref 65–99)
POTASSIUM: 4.3 mmol/L (ref 3.5–5.2)
SODIUM: 143 mmol/L (ref 134–144)
TOTAL PROTEIN: 6.4 g/dL (ref 6.0–8.5)

## 2017-11-06 LAB — CBC
Hematocrit: 43.1 % (ref 37.5–51.0)
Hemoglobin: 14.5 g/dL (ref 13.0–17.7)
MCH: 30.2 pg (ref 26.6–33.0)
MCHC: 33.6 g/dL (ref 31.5–35.7)
MCV: 90 fL (ref 79–97)
Platelets: 196 10*3/uL (ref 150–379)
RBC: 4.8 x10E6/uL (ref 4.14–5.80)
RDW: 13.2 % (ref 12.3–15.4)
WBC: 4.9 10*3/uL (ref 3.4–10.8)

## 2017-11-06 LAB — SPECIMEN STATUS REPORT

## 2017-11-06 LAB — URINALYSIS
BILIRUBIN UA: NEGATIVE
KETONES UA: NEGATIVE
LEUKOCYTES UA: NEGATIVE
NITRITE UA: NEGATIVE
RBC UA: NEGATIVE
SPEC GRAV UA: 1.028 (ref 1.005–1.030)
Urobilinogen, Ur: 0.2 mg/dL (ref 0.2–1.0)
pH, UA: 5 (ref 5.0–7.5)

## 2017-11-06 LAB — HEMOGLOBIN A1C
ESTIMATED AVERAGE GLUCOSE: 157 mg/dL
Hgb A1c MFr Bld: 7.1 % — ABNORMAL HIGH (ref 4.8–5.6)

## 2017-11-11 ENCOUNTER — Other Ambulatory Visit: Payer: Self-pay

## 2017-11-19 ENCOUNTER — Ambulatory Visit: Payer: Self-pay | Admitting: Internal Medicine

## 2018-02-20 ENCOUNTER — Telehealth: Payer: Self-pay | Admitting: Pharmacy Technician

## 2018-02-20 NOTE — Telephone Encounter (Signed)
Patient failed to provide 2019 financial documentation.  No additional medication assistance will be provided by [facility] without the required proof of income documentation.  Patient notified by letter.  Eliah Ozawa J. Xzander Gilham Care Manager [facility] 

## 2018-05-22 IMAGING — CR DG CHEST 1V
1 series · 1 of 1 positions shown · non-contrast
Comparison: 01/27/2017

CLINICAL DATA: Suspicious shadow at right nipple line rib series
today

EXAM:
CHEST 1 VIEW

[dg chest 1 view]
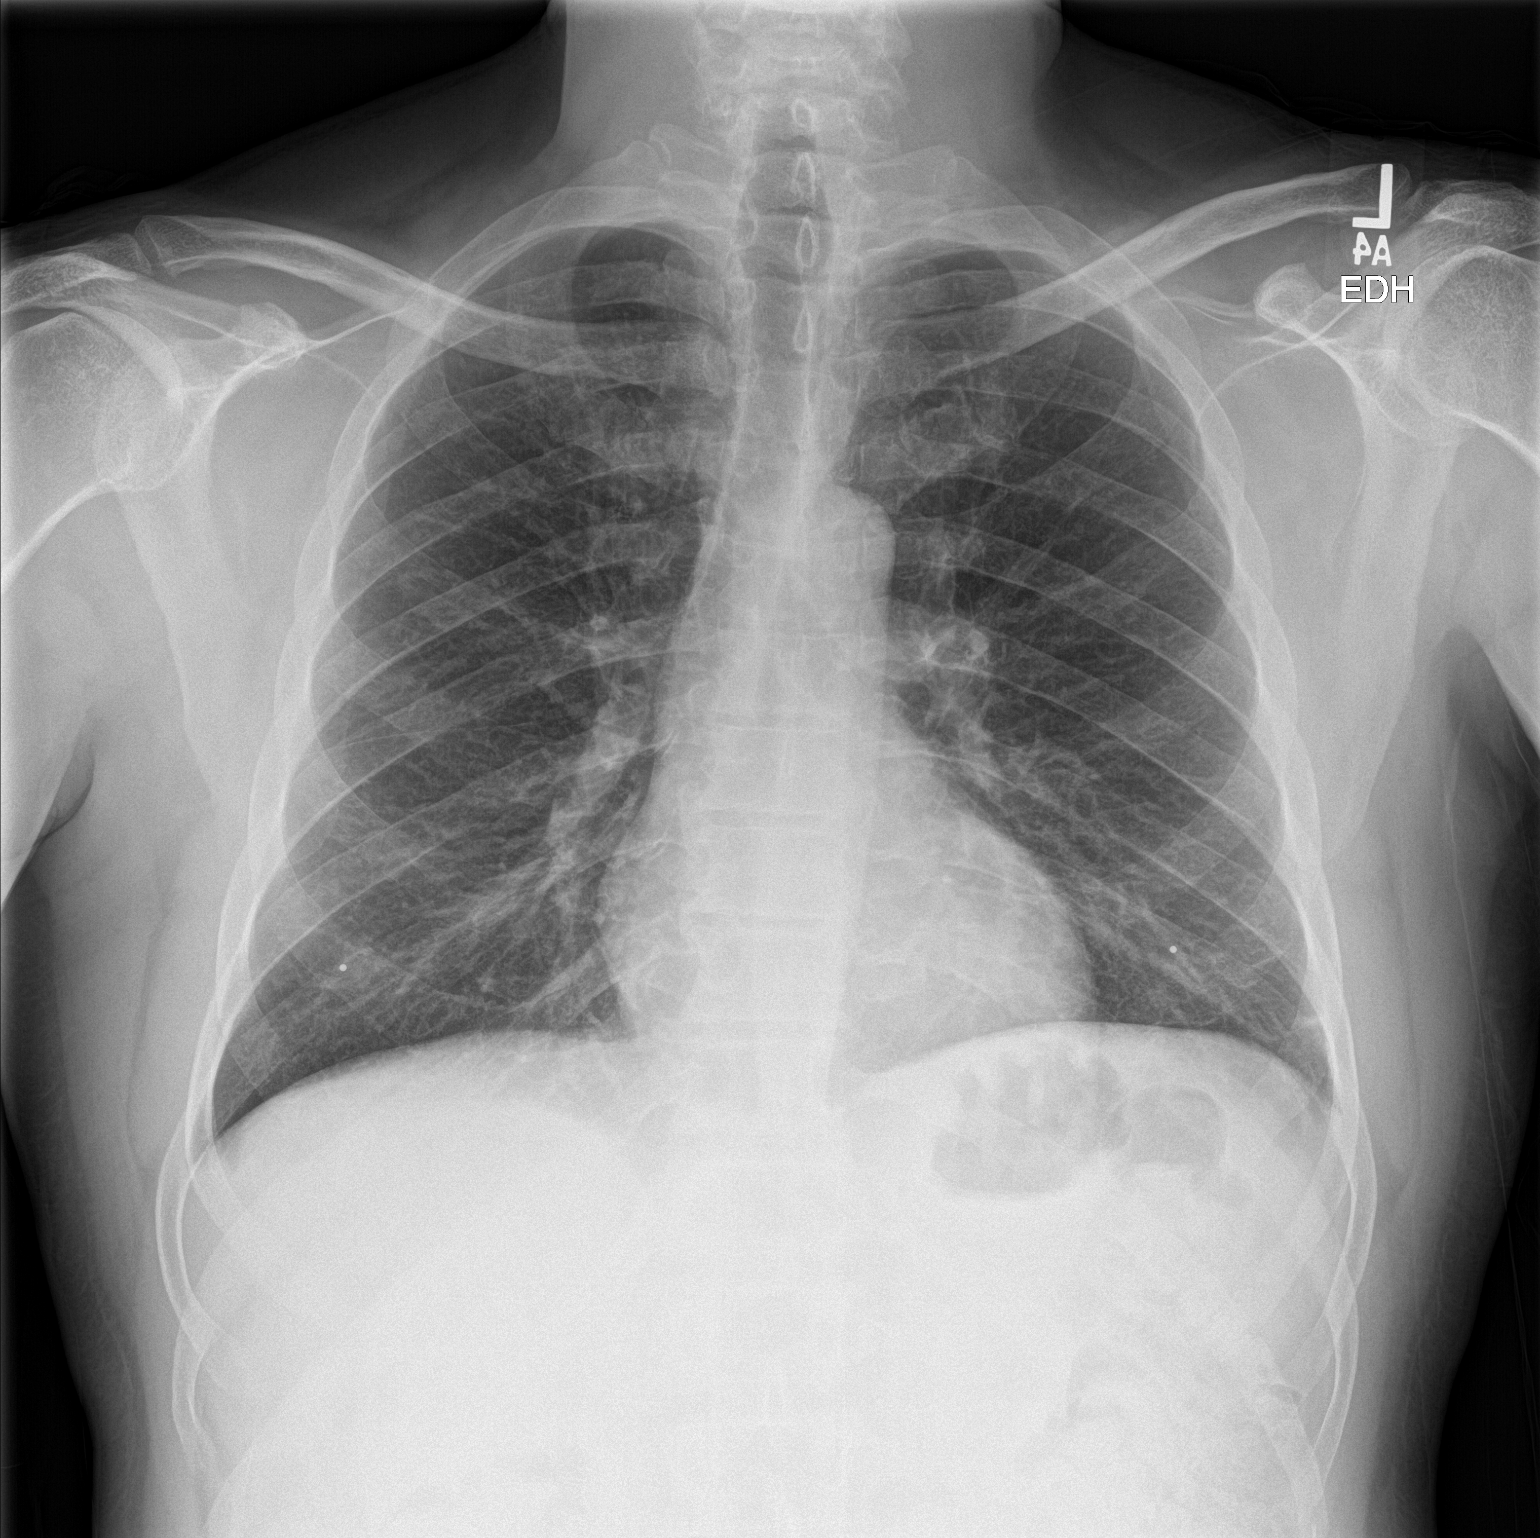

[1 of 1 positions shown; findings below may reference images not displayed]

FINDINGS: Cardiomediastinal silhouette is stable. No infiltrate or pulmonary
edema. No evidence of lung nodule. Bilateral nipple markers noted.
Nodular density right base corresponds to the nipple.
IMPRESSION: No infiltrate or pulmonary edema. No evidence of lung nodule.
Bilateral nipple markers are noted.

## 2019-11-22 ENCOUNTER — Other Ambulatory Visit: Payer: Self-pay

## 2019-11-22 ENCOUNTER — Emergency Department
Admission: EM | Admit: 2019-11-22 | Discharge: 2019-11-23 | Disposition: A | Discharge status: Home / Self Care | Payer: Self-pay | Attending: Emergency Medicine | Admitting: Emergency Medicine

## 2019-11-22 DIAGNOSIS — I10 Essential (primary) hypertension: Secondary | ICD-10-CM | POA: Insufficient documentation

## 2019-11-22 DIAGNOSIS — M79604 Pain in right leg: Secondary | ICD-10-CM | POA: Insufficient documentation

## 2019-11-22 DIAGNOSIS — F17210 Nicotine dependence, cigarettes, uncomplicated: Secondary | ICD-10-CM | POA: Insufficient documentation

## 2019-11-22 DIAGNOSIS — Z590 Homelessness: Secondary | ICD-10-CM | POA: Insufficient documentation

## 2019-11-22 DIAGNOSIS — M79605 Pain in left leg: Secondary | ICD-10-CM

## 2019-11-22 DIAGNOSIS — E119 Type 2 diabetes mellitus without complications: Secondary | ICD-10-CM | POA: Insufficient documentation

## 2019-11-22 DIAGNOSIS — Z20828 Contact with and (suspected) exposure to other viral communicable diseases: Secondary | ICD-10-CM | POA: Insufficient documentation

## 2019-11-22 DIAGNOSIS — Z79899 Other long term (current) drug therapy: Secondary | ICD-10-CM | POA: Insufficient documentation

## 2019-11-22 LAB — URINE DRUG SCREEN, QUALITATIVE (ARMC ONLY)
Amphetamines, Ur Screen: NOT DETECTED
Barbiturates, Ur Screen: NOT DETECTED
Benzodiazepine, Ur Scrn: NOT DETECTED
Cannabinoid 50 Ng, Ur ~~LOC~~: POSITIVE — AB
Cocaine Metabolite,Ur ~~LOC~~: POSITIVE — AB
MDMA (Ecstasy)Ur Screen: NOT DETECTED
Methadone Scn, Ur: NOT DETECTED
Opiate, Ur Screen: NOT DETECTED
Phencyclidine (PCP) Ur S: NOT DETECTED
Tricyclic, Ur Screen: NOT DETECTED

## 2019-11-22 LAB — COMPREHENSIVE METABOLIC PANEL
ALT: 25 U/L (ref 0–44)
AST: 19 U/L (ref 15–41)
Albumin: 3.7 g/dL (ref 3.5–5.0)
Alkaline Phosphatase: 100 U/L (ref 38–126)
Anion gap: 9 (ref 5–15)
BUN: 18 mg/dL (ref 6–20)
CO2: 23 mmol/L (ref 22–32)
Calcium: 9.1 mg/dL (ref 8.9–10.3)
Chloride: 109 mmol/L (ref 98–111)
Creatinine, Ser: 0.99 mg/dL (ref 0.61–1.24)
GFR calc Af Amer: >60 mL/min (ref >60)
GFR calc non Af Amer: >60 mL/min (ref >60)
Glucose, Bld: 170 mg/dL — ABNORMAL HIGH (ref 70–99)
Potassium: 3.7 mmol/L (ref 3.5–5.1)
Sodium: 141 mmol/L (ref 135–145)
Total Bilirubin: 1.1 mg/dL (ref 0.3–1.2)
Total Protein: 7.1 g/dL (ref 6.5–8.1)

## 2019-11-22 LAB — CBC
HCT: 48.7 % (ref 39.0–52.0)
Hemoglobin: 15.9 g/dL (ref 13.0–17.0)
MCH: 29 pg (ref 26.0–34.0)
MCHC: 32.6 g/dL (ref 30.0–36.0)
MCV: 88.9 fL (ref 80.0–100.0)
Platelets: 199 10*3/uL (ref 150–400)
RBC: 5.48 MIL/uL (ref 4.22–5.81)
RDW: 12.8 % (ref 11.5–15.5)
WBC: 4.9 10*3/uL (ref 4.0–10.5)
nRBC: 0 % (ref 0.0–0.2)

## 2019-11-22 LAB — ETHANOL: Alcohol, Ethyl (B): <10 mg/dL (ref <10)

## 2019-11-22 NOTE — ED Notes (Signed)
Attempted to introduce self to pt, pt appears to be sleeping. Respirations even and unlabored. Will continue to monitor.

## 2019-11-22 NOTE — ED Notes (Signed)
Pt sitting in bed speaking with this RN in NAD, reports pain that starts in lower right leg that radiates to right foot x months.

## 2019-11-22 NOTE — ED Notes (Signed)
Pt standing at sink brushing teeth in NAD, given warm blanket, advised to call out if any needs arise

## 2019-11-22 NOTE — ED Provider Notes (Signed)
[facility] Emergency Department Provider Note ____________________________________________   First MD Initiated Contact with Patient 11/22/19 1819     (approximate)  I have reviewed the triage vital signs and the nursing notes.   HISTORY  Chief Complaint Leg Pain    HPI Max Brown is a 53 y.o. male with PMH as noted below who presents essentially because he is homeless.  The patient states that he has been staying in an abandoned house.  He states he has newly been homeless since he relapsed on drugs during Thanksgiving.  He states he tried to get into a shelter, but they would not accept him because he needed a COVID-19 test.  The patient reports chronic pain to both legs related to arthritis, and occasional sharp shooting pains in his right leg.  These have been unchanged for months.  He denies any acute injury.  He also states that he has been off of his diabetes and cholesterol medications for several weeks.   Past Medical History:  Diagnosis Date   Aneurysm (HCC)    left side of brain, 1997   Diabetes mellitus without complication (HCC)    Hepatitis C    pt states in 2009 diagnosed and received some meds for it, but has not gotten anymore   Hypertension     There are no active problems to display for this patient.   History reviewed. No pertinent surgical history.  Prior to Admission medications   Medication Sig Start Date End Date Taking? Authorizing Provider  ASPIRIN ADULT LOW STRENGTH 81 MG EC tablet Take 81 mg by mouth daily. 09/08/19  Yes [provider]  atorvastatin (LIPITOR) 10 MG tablet Take 1 tablet (10 mg total) by mouth daily. 06/04/17  Yes Virl Axe, MD  INVOKANA 100 MG TABS tablet Take 100 mg by mouth daily. 09/08/19  Yes [provider]  JANUVIA 100 MG tablet Take 100 mg by mouth daily. 09/08/19  Yes [provider]  metFORMIN (GLUCOPHAGE) 1000 MG tablet Take 1 tablet (1,000 mg total) by mouth  2 (two) times daily. 06/04/17  Yes Virl Axe, MD    Allergies Patient has no known allergies.  No family history on file.  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 ENT: No sore throat. Cardiovascular: Denies chest pain. Respiratory: Denies shortness of breath. Gastrointestinal: No nausea, no vomiting.  No diarrhea.  Genitourinary: Negative for dysuria.  Musculoskeletal: Positive for leg pain. Skin: Negative for rash. Neurological: Negative for headaches, focal weakness or numbness.   ____________________________________________   PHYSICAL EXAM:  VITAL SIGNS: ED Triage Vitals [11/22/19 1315]  Enc Vitals Group     BP 132/78     Pulse Rate 71     Resp 17     Temp 97.7 F (36.5 C)     Temp Source Oral     SpO2 99 %     Weight 194 lb (88 kg)     Height 5\' 10"  (1.778 m)  Head Circumference      Peak Flow      Pain Score 7     Pain Loc      Pain Edu?      Excl. in [place]?     Constitutional: Alert and oriented. Well appearing and in no acute distress. Eyes: Conjunctivae are normal.  Head: Atraumatic. Nose: No congestion/rhinnorhea. Mouth/Throat: Mucous membranes are moist.   Neck: Normal range of motion.  Cardiovascular: Normal rate, regular rhythm.  Good peripheral circulation. Respiratory: Normal respiratory effort.  No retractions.  Gastrointestinal:  No distention.  Musculoskeletal: No lower extremity edema.  Extremities warm and well perfused.  Full range of motion to bilateral lower extremities.  No bony tenderness. Neurologic:  Normal speech and language. No  gross focal neurologic deficits are appreciated.  Skin:  Skin is warm and dry. No rash noted. Psychiatric: Mood and affect are normal. Speech and behavior are normal.  ____________________________________________   LABS (all labs ordered are listed, but only abnormal results are displayed)  Labs Reviewed  COMPREHENSIVE METABOLIC PANEL - Abnormal; Notable for the following components:      Result Value   Glucose, Bld 170 (*)    All other components within normal limits  URINE DRUG SCREEN, QUALITATIVE ([facility] ONLY) - Abnormal; Notable for the following components:   Cocaine Metabolite,Ur [place] POSITIVE (*)    Cannabinoid 50 Ng, Ur [place] POSITIVE (*)    All other components within normal limits  SARS CORONAVIRUS 2 (TAT 6-24 HRS)  ETHANOL  CBC   ____________________________________________  EKG   ____________________________________________  RADIOLOGY    ____________________________________________   PROCEDURES  Procedure(s) performed: No  Procedures  Critical Care performed: No ____________________________________________   INITIAL IMPRESSION / ASSESSMENT AND PLAN / ED COURSE  Pertinent labs & imaging results that were available during my care of the patient were reviewed by me and considered in my medical decision making (see chart for details).  53 year old male with PMH as noted above presents due to homelessness.  He states he has been staying in an abandoned house and is afraid that if he tries to stay there tonight he will freeze to death as it will be 30 degrees out.  He was not able to get into a shelter because he did not have a COVID-19 test.  He denies any acute medical complaints.  He apparently had told the shelter that he was going to kill himself if they did not let him eat, but he denies any SI or HI at this time.  On exam, the patient is well-appearing and his vital signs are normal.  He is calm and cooperative.  I reviewed the past medical records in [place] and confirmed his past history.  We will obtain a 6-24-hour COVID-19 swab so that the patient taken to the shelter tomorrow.  Because it will be very cold out tonight, I will let the patient stay in the ED overnight for safety.  His lab work-up was unremarkable and he does not require any acute treatment.  ----------------------------------------- 11:24 PM on 11/22/2019 -----------------------------------------  The patient has had no further acute issues.  I signed him out to the oncoming physician Dr. Joan Mayans.  Plan will be likely discharge in the morning.  _________________________  Carollee Herter was evaluated in Emergency Department on 11/22/2019 for the symptoms described in the history of present illness. He was evaluated in the context of the global COVID-19 pandemic, which necessitated consideration that the patient might be at risk  for infection with the SARS-CoV-2 virus that causes COVID-19. Institutional protocols and algorithms that pertain to the evaluation of patients at risk for COVID-19 are in a state of rapid change based on information released by regulatory bodies including the CDC and federal and state organizations. These policies and algorithms were followed during the patient's care in the ED.  ____________________________________________   FINAL CLINICAL IMPRESSION(S) / ED DIAGNOSES  Final diagnoses:  Pain in both lower extremities      NEW MEDICATIONS STARTED DURING THIS VISIT:  New Prescriptions   No medications on file     Note:  This document was prepared using Dragon voice recognition software and may include unintentional dictation errors.    Dionne BucySiadecki, Jenayah Antu, MD 11/22/19 2324

## 2019-11-22 NOTE — ED Notes (Signed)
Report given to Paige,RN.

## 2019-11-22 NOTE — ED Triage Notes (Signed)
Pt to ED via ems with reports that pt is homeless and they wouldn't let him into the homeless shelter- pt told staff that he was going to kill himself if they wouldn't let him in to eat. Pt was allowed to eat but not allowed to stay in shelter d/t not having covid testing complete. EMS was called to transport for psych eval- pt denies SI/HI upon arrival.

## 2019-11-22 NOTE — ED Triage Notes (Signed)
Pt c/o BL LE arthritis pain , pt states "Im in bad shape, Im homeless and they aint doing no covid test at the shelter so I cant get in". Pt is in NAD. Denies cough, congestion.

## 2019-11-22 NOTE — ED Notes (Signed)
Pt given dinner tray.

## 2019-11-23 LAB — SARS CORONAVIRUS 2 (TAT 6-24 HRS): SARS Coronavirus 2: NEGATIVE

## 2019-11-23 MED ORDER — INVOKANA 100 MG PO TABS: 100.0000 mg | ORAL_TABLET | Freq: Every day | ORAL | 1 refills | Status: DC

## 2019-11-23 MED ORDER — ATORVASTATIN CALCIUM 10 MG PO TABS: 10.0000 mg | ORAL_TABLET | Freq: Every day | ORAL | 3 refills | Status: DC

## 2019-11-23 MED ORDER — JANUVIA 100 MG PO TABS: 100.0000 mg | ORAL_TABLET | Freq: Every day | ORAL | 1 refills | Status: DC

## 2019-11-23 MED ORDER — METFORMIN HCL 1000 MG PO TABS: 1000.0000 mg | ORAL_TABLET | Freq: Two times a day (BID) | ORAL | 3 refills | Status: DC

## 2019-11-23 MED ORDER — ASPIRIN ADULT LOW STRENGTH 81 MG PO TBEC: 81.0000 mg | DELAYED_RELEASE_TABLET | Freq: Every day | ORAL | 1 refills | Status: DC

## 2019-11-23 NOTE — ED Notes (Signed)
PT resting in bed with eyes closed. Easy respirations. Has TV playing on his phone. PT appears in NAD.

## 2019-11-23 NOTE — Discharge Instructions (Addendum)
Thank you for letting us take care of you in the emergency department today. Your COVID test today was NEGATIVE.  Please continue to take any regular, prescribed medications.   Please return to the ER for any new or worsening symptoms.

## 2019-12-29 ENCOUNTER — Telehealth: Payer: Self-pay | Admitting: Pharmacy Technician

## 2019-12-29 NOTE — Telephone Encounter (Signed)
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  [employer] transferred message to me.  Patient did not provide a phone number and phone number in Epic is not working.  Sherilyn Dacosta Care Manager [facility]

## 2020-01-13 ENCOUNTER — Encounter: Payer: Self-pay | Admitting: Gerontology

## 2020-01-13 ENCOUNTER — Ambulatory Visit: Payer: Self-pay | Admitting: Gerontology

## 2020-01-13 ENCOUNTER — Other Ambulatory Visit: Payer: Self-pay

## 2020-01-13 VITALS — BP 156/96 | HR 70 | Ht 70.0 in | Wt 185.0 lb

## 2020-01-13 DIAGNOSIS — F1910 Other psychoactive substance abuse, uncomplicated: Secondary | ICD-10-CM

## 2020-01-13 DIAGNOSIS — E785 Hyperlipidemia, unspecified: Secondary | ICD-10-CM | POA: Insufficient documentation

## 2020-01-13 DIAGNOSIS — M79675 Pain in left toe(s): Secondary | ICD-10-CM | POA: Insufficient documentation

## 2020-01-13 DIAGNOSIS — Z7689 Persons encountering health services in other specified circumstances: Secondary | ICD-10-CM | POA: Insufficient documentation

## 2020-01-13 DIAGNOSIS — I10 Essential (primary) hypertension: Secondary | ICD-10-CM

## 2020-01-13 DIAGNOSIS — E119 Type 2 diabetes mellitus without complications: Secondary | ICD-10-CM | POA: Insufficient documentation

## 2020-01-13 DIAGNOSIS — M79604 Pain in right leg: Secondary | ICD-10-CM

## 2020-01-13 MED ORDER — BLOOD GLUCOSE MONITOR KIT: PACK | 0 refills | Status: AC

## 2020-01-13 MED ORDER — ASPIRIN ADULT LOW STRENGTH 81 MG PO TBEC: 81.0000 mg | DELAYED_RELEASE_TABLET | Freq: Every day | ORAL | 3 refills | Status: DC

## 2020-01-13 MED ORDER — AMLODIPINE BESYLATE 5 MG PO TABS: 5.0000 mg | ORAL_TABLET | Freq: Every day | ORAL | 0 refills | Status: DC

## 2020-01-13 NOTE — Patient Instructions (Signed)
Smoking Tobacco Information, Adult Smoking tobacco can be harmful to your health. Tobacco contains a poisonous (toxic), colorless chemical called nicotine. Nicotine is addictive. It changes the brain and can make it hard to stop smoking. Tobacco also has other toxic chemicals that can hurt your body and raise your risk of many cancers. How can smoking tobacco affect me? Smoking tobacco puts you at risk for:  Cancer. Smoking is most commonly associated with lung cancer, but can also lead to cancer in other parts of the body.  Chronic obstructive pulmonary disease (COPD). This is a long-term lung condition that makes it hard to breathe. It also gets worse over time.  High blood pressure (hypertension), heart disease, stroke, or heart attack.  Lung infections, such as pneumonia.  Cataracts. This is when the lenses in the eyes become clouded.  Digestive problems. This may include peptic ulcers, heartburn, and gastroesophageal reflux disease (GERD).  Oral health problems, such as gum disease and tooth loss.  Loss of taste and smell. Smoking can affect your appearance by causing:  Wrinkles.  Yellow or stained teeth, fingers, and fingernails. Smoking tobacco can also affect your social life, because:  It may be challenging to find places to smoke when away from home. Many workplaces, restaurants, hotels, and public places are tobacco-free.  Smoking is expensive. This is due to the cost of tobacco and the long-term costs of treating health problems from smoking.  Secondhand smoke may affect those around you. Secondhand smoke can cause lung cancer, breathing problems, and heart disease. Children of smokers have a higher risk for: ? Sudden infant death syndrome (SIDS). ? Ear infections. ? Lung infections. If you currently smoke tobacco, quitting now can help you:  Lead a longer and healthier life.  Look, smell, breathe, and feel better over time.  Save money.  Protect others from the  harms of secondhand smoke. What actions can I take to prevent health problems? Quit smoking   Do not start smoking. Quit if you already do.  Make a plan to quit smoking and commit to it. Look for programs to help you and ask your health care provider for recommendations and ideas.  Set a date and write down all the reasons you want to quit.  Let your friends and family know you are quitting so they can help and support you. Consider finding friends who also want to quit. It can be easier to quit with someone else, so that you can support each other.  Talk with your health care provider about using nicotine replacement medicines to help you quit, such as gum, lozenges, patches, sprays, or pills.  Do not replace cigarette smoking with electronic cigarettes, which are commonly called e-cigarettes. The safety of e-cigarettes is not known, and some may contain harmful chemicals.  If you try to quit but return to smoking, stay positive. It is common to slip up when you first quit, so take it one day at a time.  Be prepared for cravings. When you feel the urge to smoke, chew gum or suck on hard candy. Lifestyle  Stay busy and take care of your body.  Drink enough fluid to keep your urine pale yellow.  Get plenty of exercise and eat a healthy diet. This can help prevent weight gain after quitting.  Monitor your eating habits. Quitting smoking can cause you to have a larger appetite than when you smoke.  Find ways to relax. Go out with friends or family to a movie or a restaurant   where people do not smoke.  Ask your health care provider about having regular tests (screenings) to check for cancer. This may include blood tests, imaging tests, and other tests.  Find ways to manage your stress, such as meditation, yoga, or exercise. Where to find support To get support to quit smoking, consider:  Asking your health care provider for more information and resources.  Taking classes to learn  more about quitting smoking.  Looking for local organizations that offer resources about quitting smoking.  Joining a support group for people who want to quit smoking in your local community.  Calling the smokefree.gov counselor helpline: 1-800-Quit-Now (1-800-784-8669) Where to find more information You may find more information about quitting smoking from:  HelpGuide.org: www.helpguide.org  Smokefree.gov: smokefree.gov  American Lung Association: www.lung.org Contact a health care provider if you:  Have problems breathing.  Notice that your lips, nose, or fingers turn blue.  Have chest pain.  Are coughing up blood.  Feel faint or you pass out.  Have other health changes that cause you to worry. Summary  Smoking tobacco can negatively affect your health, the health of those around you, your finances, and your social life.  Do not start smoking. Quit if you already do. If you need help quitting, ask your health care provider.  Think about joining a support group for people who want to quit smoking in your local community. There are many effective programs that will help you to quit this behavior. This information is not intended to replace advice given to you by your health care provider. Make sure you discuss any questions you have with your health care provider. Document Revised: 08/27/2019 Document Reviewed: 12/17/2016 Elsevier Patient Education  2020 [employer] Fat and Cholesterol Restricted Eating Plan Getting too much fat and cholesterol in your diet may cause health problems. Choosing the right foods helps keep your fat and cholesterol at normal levels. This can keep you from getting certain diseases. Your doctor may recommend an eating plan that includes:  Total fat: ______% or less of total calories a day.  Saturated fat: ______% or less of total calories a day.  Cholesterol: less than _________mg a day.  Fiber: ______g a day. What are tips for following  this plan? Meal planning  At meals, divide your plate into four equal parts: ? Fill one-half of your plate with vegetables and green salads. ? Fill one-fourth of your plate with whole grains. ? Fill one-fourth of your plate with low-fat (lean) protein foods.  Eat fish that is high in omega-3 fats at least two times a week. This includes mackerel, tuna, sardines, and salmon.  Eat foods that are high in fiber, such as whole grains, beans, apples, broccoli, carrots, peas, and barley. General tips   Work with your doctor to lose weight if you need to.  Avoid: ? Foods with added sugar. ? Fried foods. ? Foods with partially hydrogenated oils.  Limit alcohol intake to no more than 1 drink a day for nonpregnant women and 2 drinks a day for men. One drink equals 12 oz of beer, 5 oz of wine, or 1 oz of hard liquor. Reading food labels  Check food labels for: ? Trans fats. ? Partially hydrogenated oils. ? Saturated fat (g) in each serving. ? Cholesterol (mg) in each serving. ? Fiber (g) in each serving.  Choose foods with healthy fats, such as: ? Monounsaturated fats. ? Polyunsaturated fats. ? Omega-3 fats.  Choose grain products that have whole grains.   Look for the word "whole" as the first word in the ingredient list. Cooking  Cook foods using low-fat methods. These include baking, boiling, grilling, and broiling.  Eat more home-cooked foods. Eat at restaurants and buffets less often.  Avoid cooking using saturated fats, such as butter, cream, palm oil, palm kernel oil, and coconut oil. Recommended foods  Fruits  All fresh, canned (in natural juice), or frozen fruits. Vegetables  Fresh or frozen vegetables (raw, steamed, roasted, or grilled). Green salads. Grains  Whole grains, such as whole wheat or whole grain breads, crackers, cereals, and pasta. Unsweetened oatmeal, bulgur, barley, quinoa, or brown rice. Corn or whole wheat flour tortillas. Meats and other protein  foods  Ground beef (85% or leaner), grass-fed beef, or beef trimmed of fat. Skinless chicken or turkey. Ground chicken or turkey. Pork trimmed of fat. All fish and seafood. Egg whites. Dried beans, peas, or lentils. Unsalted nuts or seeds. Unsalted canned beans. Nut butters without added sugar or oil. Dairy  Low-fat or nonfat dairy products, such as skim or 1% milk, 2% or reduced-fat cheeses, low-fat and fat-free ricotta or cottage cheese, or plain low-fat and nonfat yogurt. Fats and oils  Tub margarine without trans fats. Light or reduced-fat mayonnaise and salad dressings. Avocado. Olive, canola, sesame, or safflower oils. The items listed above may not be a complete list of foods and beverages you can eat. Contact a dietitian for more information. Foods to avoid Fruits  Canned fruit in heavy syrup. Fruit in cream or butter sauce. Fried fruit. Vegetables  Vegetables cooked in cheese, cream, or butter sauce. Fried vegetables. Grains  White bread. White pasta. White rice. Cornbread. Bagels, pastries, and croissants. Crackers and snack foods that contain trans fat and hydrogenated oils. Meats and other protein foods  Fatty cuts of meat. Ribs, chicken wings, bacon, sausage, bologna, salami, chitterlings, fatback, hot dogs, bratwurst, and packaged lunch meats. Liver and organ meats. Whole eggs and egg yolks. Chicken and turkey with skin. Fried meat. Dairy  Whole or 2% milk, cream, half-and-half, and cream cheese. Whole milk cheeses. Whole-fat or sweetened yogurt. Full-fat cheeses. Nondairy creamers and whipped toppings. Processed cheese, cheese spreads, and cheese curds. Beverages  Alcohol. Sugar-sweetened drinks such as sodas, lemonade, and fruit drinks. Fats and oils  Butter, stick margarine, lard, shortening, ghee, or bacon fat. Coconut, palm kernel, and palm oils. Sweets and desserts  Corn syrup, sugars, honey, and molasses. Candy. Jam and jelly. Syrup. Sweetened cereals. Cookies,  pies, cakes, donuts, muffins, and ice cream. The items listed above may not be a complete list of foods and beverages you should avoid. Contact a dietitian for more information. Summary  Choosing the right foods helps keep your fat and cholesterol at normal levels. This can keep you from getting certain diseases.  At meals, fill one-half of your plate with vegetables and green salads.  Eat high-fiber foods, like whole grains, beans, apples, carrots, peas, and barley.  Limit added sugar, saturated fats, alcohol, and fried foods. This information is not intended to replace advice given to you by your health care provider. Make sure you discuss any questions you have with your health care provider. Document Revised: 08/05/2018 Document Reviewed: 08/19/2017 Elsevier Patient Education  2020 [employer] Carbohydrate Counting for Diabetes Mellitus, Adult  Carbohydrate counting is a method of keeping track of how many carbohydrates you eat. Eating carbohydrates naturally increases the amount of sugar (glucose) in the blood. Counting how many carbohydrates you eat helps keep your blood glucose within   normal limits, which helps you manage your diabetes (diabetes mellitus). It is important to know how many carbohydrates you can safely have in each meal. This is different for every person. A diet and nutrition specialist (registered dietitian) can help you make a meal plan and calculate how many carbohydrates you should have at each meal and snack. Carbohydrates are found in the following foods:  Grains, such as breads and cereals.  Dried beans and soy products.  Starchy vegetables, such as potatoes, peas, and corn.  Fruit and fruit juices.  Milk and yogurt.  Sweets and snack foods, such as cake, cookies, candy, chips, and soft drinks. How do I count carbohydrates? There are two ways to count carbohydrates in food. You can use either of the methods or a combination of both. Reading "Nutrition  Facts" on packaged food The "Nutrition Facts" list is included on the labels of almost all packaged foods and beverages in the [place] It includes:  The serving size.  Information about nutrients in each serving, including the grams (g) of carbohydrate per serving. To use the "Nutrition Facts":  Decide how many servings you will have.  Multiply the number of servings by the number of carbohydrates per serving.  The resulting number is the total amount of carbohydrates that you will be having. Learning standard serving sizes of other foods When you eat carbohydrate foods that are not packaged or do not include "Nutrition Facts" on the label, you need to measure the servings in order to count the amount of carbohydrates:  Measure the foods that you will eat with a food scale or measuring cup, if needed.  Decide how many standard-size servings you will eat.  Multiply the number of servings by 15. Most carbohydrate-rich foods have about 15 g of carbohydrates per serving. ? For example, if you eat 8 oz (170 g) of strawberries, you will have eaten 2 servings and 30 g of carbohydrates (2 servings x 15 g = 30 g).  For foods that have more than one food mixed, such as soups and casseroles, you must count the carbohydrates in each food that is included. The following list contains standard serving sizes of common carbohydrate-rich foods. Each of these servings has about 15 g of carbohydrates:   hamburger bun or  English muffin.   oz (15 mL) syrup.   oz (14 g) jelly.  1 slice of bread.  1 six-inch tortilla.  3 oz (85 g) cooked rice or pasta.  4 oz (113 g) cooked dried beans.  4 oz (113 g) starchy vegetable, such as peas, corn, or potatoes.  4 oz (113 g) hot cereal.  4 oz (113 g) mashed potatoes or  of a large baked potato.  4 oz (113 g) canned or frozen fruit.  4 oz (120 mL) fruit juice.  4-6 crackers.  6 chicken nuggets.  6 oz (170 g) unsweetened dry cereal.  6 oz (170  g) plain fat-free yogurt or yogurt sweetened with artificial sweeteners.  8 oz (240 mL) milk.  8 oz (170 g) fresh fruit or one small piece of fruit.  24 oz (680 g) popped popcorn. Example of carbohydrate counting Sample meal  3 oz (85 g) chicken breast.  6 oz (170 g) brown rice.  4 oz (113 g) corn.  8 oz (240 mL) milk.  8 oz (170 g) strawberries with sugar-free whipped topping. Carbohydrate calculation 1. Identify the foods that contain carbohydrates: ? Rice. ? Corn. ? Milk. ? Strawberries. 2. Calculate how   many servings you have of each food: ? 2 servings rice. ? 1 serving corn. ? 1 serving milk. ? 1 serving strawberries. 3. Multiply each number of servings by 15 g: ? 2 servings rice x 15 g = 30 g. ? 1 serving corn x 15 g = 15 g. ? 1 serving milk x 15 g = 15 g. ? 1 serving strawberries x 15 g = 15 g. 4. Add together all of the amounts to find the total grams of carbohydrates eaten: ? 30 g + 15 g + 15 g + 15 g = 75 g of carbohydrates total. Summary  Carbohydrate counting is a method of keeping track of how many carbohydrates you eat.  Eating carbohydrates naturally increases the amount of sugar (glucose) in the blood.  Counting how many carbohydrates you eat helps keep your blood glucose within normal limits, which helps you manage your diabetes.  A diet and nutrition specialist (registered dietitian) can help you make a meal plan and calculate how many carbohydrates you should have at each meal and snack. This information is not intended to replace advice given to you by your health care provider. Make sure you discuss any questions you have with your health care provider. Document Revised: 06/26/2017 Document Reviewed: 05/15/2016 Elsevier Patient Education  2020 [employer] DASH Eating Plan DASH stands for "Dietary Approaches to Stop Hypertension." The DASH eating plan is a healthy eating plan that has been shown to reduce high blood pressure (hypertension). It  may also reduce your risk for type 2 diabetes, heart disease, and stroke. The DASH eating plan may also help with weight loss. What are tips for following this plan?  General guidelines  Avoid eating more than 2,300 mg (milligrams) of salt (sodium) a day. If you have hypertension, you may need to reduce your sodium intake to 1,500 mg a day.  Limit alcohol intake to no more than 1 drink a day for nonpregnant women and 2 drinks a day for men. One drink equals 12 oz of beer, 5 oz of wine, or 1 oz of hard liquor.  Work with your health care provider to maintain a healthy body weight or to lose weight. Ask what an ideal weight is for you.  Get at least 30 minutes of exercise that causes your heart to beat faster (aerobic exercise) most days of the week. Activities may include walking, swimming, or biking.  Work with your health care provider or diet and nutrition specialist (dietitian) to adjust your eating plan to your individual calorie needs. Reading food labels   Check food labels for the amount of sodium per serving. Choose foods with less than 5 percent of the Daily Value of sodium. Generally, foods with less than 300 mg of sodium per serving fit into this eating plan.  To find whole grains, look for the word "whole" as the first word in the ingredient list. Shopping  Buy products labeled as "low-sodium" or "no salt added."  Buy fresh foods. Avoid canned foods and premade or frozen meals. Cooking  Avoid adding salt when cooking. Use salt-free seasonings or herbs instead of table salt or sea salt. Check with your health care provider or pharmacist before using salt substitutes.  Do not fry foods. Cook foods using healthy methods such as baking, boiling, grilling, and broiling instead.  Cook with heart-healthy oils, such as olive, canola, soybean, or sunflower oil. Meal planning  Eat a balanced diet that includes: ? 5 or more servings of fruits and   vegetables each day. At each  meal, try to fill half of your plate with fruits and vegetables. ? Up to 6-8 servings of whole grains each day. ? Less than 6 oz of lean meat, poultry, or fish each day. A 3-oz serving of meat is about the same size as a deck of cards. One egg equals 1 oz. ? 2 servings of low-fat dairy each day. ? A serving of nuts, seeds, or beans 5 times each week. ? Heart-healthy fats. Healthy fats called Omega-3 fatty acids are found in foods such as flaxseeds and coldwater fish, like sardines, salmon, and mackerel.  Limit how much you eat of the following: ? Canned or prepackaged foods. ? Food that is high in trans fat, such as fried foods. ? Food that is high in saturated fat, such as fatty meat. ? Sweets, desserts, sugary drinks, and other foods with added sugar. ? Full-fat dairy products.  Do not salt foods before eating.  Try to eat at least 2 vegetarian meals each week.  Eat more home-cooked food and less restaurant, buffet, and fast food.  When eating at a restaurant, ask that your food be prepared with less salt or no salt, if possible. What foods are recommended? The items listed may not be a complete list. Talk with your dietitian about what dietary choices are best for you. Grains Whole-grain or whole-wheat bread. Whole-grain or whole-wheat pasta. Brown rice. Oatmeal. Quinoa. Bulgur. Whole-grain and low-sodium cereals. Pita bread. Low-fat, low-sodium crackers. Whole-wheat flour tortillas. Vegetables Fresh or frozen vegetables (raw, steamed, roasted, or grilled). Low-sodium or reduced-sodium tomato and vegetable juice. Low-sodium or reduced-sodium tomato sauce and tomato paste. Low-sodium or reduced-sodium canned vegetables. Fruits All fresh, dried, or frozen fruit. Canned fruit in natural juice (without added sugar). Meat and other protein foods Skinless chicken or turkey. Ground chicken or turkey. Pork with fat trimmed off. Fish and seafood. Egg whites. Dried beans, peas, or lentils.  Unsalted nuts, nut butters, and seeds. Unsalted canned beans. Lean cuts of beef with fat trimmed off. Low-sodium, lean deli meat. Dairy Low-fat (1%) or fat-free (skim) milk. Fat-free, low-fat, or reduced-fat cheeses. Nonfat, low-sodium ricotta or cottage cheese. Low-fat or nonfat yogurt. Low-fat, low-sodium cheese. Fats and oils Soft margarine without trans fats. Vegetable oil. Low-fat, reduced-fat, or light mayonnaise and salad dressings (reduced-sodium). Canola, safflower, olive, soybean, and sunflower oils. Avocado. Seasoning and other foods Herbs. Spices. Seasoning mixes without salt. Unsalted popcorn and pretzels. Fat-free sweets. What foods are not recommended? The items listed may not be a complete list. Talk with your dietitian about what dietary choices are best for you. Grains Baked goods made with fat, such as croissants, muffins, or some breads. Dry pasta or rice meal packs. Vegetables Creamed or fried vegetables. Vegetables in a cheese sauce. Regular canned vegetables (not low-sodium or reduced-sodium). Regular canned tomato sauce and paste (not low-sodium or reduced-sodium). Regular tomato and vegetable juice (not low-sodium or reduced-sodium). Pickles. Olives. Fruits Canned fruit in a light or heavy syrup. Fried fruit. Fruit in cream or butter sauce. Meat and other protein foods Fatty cuts of meat. Ribs. Fried meat. Bacon. Sausage. Bologna and other processed lunch meats. Salami. Fatback. Hotdogs. Bratwurst. Salted nuts and seeds. Canned beans with added salt. Canned or smoked fish. Whole eggs or egg yolks. Chicken or turkey with skin. Dairy Whole or 2% milk, cream, and half-and-half. Whole or full-fat cream cheese. Whole-fat or sweetened yogurt. Full-fat cheese. Nondairy creamers. Whipped toppings. Processed cheese and cheese spreads. Fats and oils   Butter. Stick margarine. Lard. Shortening. Ghee. Bacon fat. Tropical oils, such as coconut, palm kernel, or palm oil. Seasoning and  other foods Salted popcorn and pretzels. Onion salt, garlic salt, seasoned salt, table salt, and sea salt. Worcestershire sauce. Tartar sauce. Barbecue sauce. Teriyaki sauce. Soy sauce, including reduced-sodium. Steak sauce. Canned and packaged gravies. Fish sauce. Oyster sauce. Cocktail sauce. Horseradish that you find on the shelf. Ketchup. Mustard. Meat flavorings and tenderizers. Bouillon cubes. Hot sauce and Tabasco sauce. Premade or packaged marinades. Premade or packaged taco seasonings. Relishes. Regular salad dressings. Where to find more information:  National Heart, Lung, and Blood Institute: www.nhlbi.nih.gov  American Heart Association: www.heart.org Summary  The DASH eating plan is a healthy eating plan that has been shown to reduce high blood pressure (hypertension). It may also reduce your risk for type 2 diabetes, heart disease, and stroke.  With the DASH eating plan, you should limit salt (sodium) intake to 2,300 mg a day. If you have hypertension, you may need to reduce your sodium intake to 1,500 mg a day.  When on the DASH eating plan, aim to eat more fresh fruits and vegetables, whole grains, lean proteins, low-fat dairy, and heart-healthy fats.  Work with your health care provider or diet and nutrition specialist (dietitian) to adjust your eating plan to your individual calorie needs. This information is not intended to replace advice given to you by your health care provider. Make sure you discuss any questions you have with your health care provider. Document Revised: 11/14/2017 Document Reviewed: 11/25/2016 Elsevier Patient Education  2020 [employer] Inc[employer]  

## 2020-01-13 NOTE — Progress Notes (Signed)
Patient ID: Max Brown, male   DOB: 1966-10-28, 54 y.o.   MRN: [id number]  Chief Complaint  Patient presents with  . Establish Care    HPI Max Brown is a 54 y.o. male who presents to establish care and evaluation of his chronic conditions. He states that he has a history of type 2 diabetes mellitus and takes Metformin 500 mg daily. He states that he stopped taking  Januvia 100 mg and Invokana 100 mg when he left [facility] rehabilitation center in November 2020. He also reports that he has not checked his blood glucose since November because he left his glucometer in [place]. His HgbA1c was checked during visit and it was 6.2% and his blood glucose reading was 181 mg/dl. He denies hypoglycemic/hyperglycemic symptoms, and performs daily foot checks. His blood pressure during visit was 156/96 during visit and he states that he has not being diagnosed with hypertension. He reports that he smokes 4-5 cigarettes daily and admits the desire to quit. He also states that he used cocaine 2 weeks ago and wants to quit. He has a history of hyperlipidemia and continues on 10 mg Atorvastatin. He also c/o intermittent shooting pain to right lower leg that radiates to his foot. He states that it started 2-3 weeks ago. He denies any trauma, aggravating or relieving factor. He states that his pain last for 5 minutes. He also states that he also experiences intermittent pain to 2nd toe of left foot. He was seen at the ED on 11/22/2019 for chronic pain to right leg. He denies chest pain, palpitation, light headedness, myalgia, motor weakness, fever and chills. Overall, he states that he's doing well and offers no further complaint.   Past Medical History:  Diagnosis Date  . Aneurysm (HCC)    left side of brain, 1997  . Diabetes mellitus without complication (HCC)   . Hepatitis C    pt states in 2009 diagnosed and received some meds for it, but has not gotten anymore  . Hypertension     No past surgical history on  file.  No family history on file.  Social History Social History   Tobacco Use  . Smoking status: Current Every Day Smoker    Packs/day: 0.20    Years: 30.00    Pack years: 6.00    Types: Cigarettes  . Smokeless tobacco: Never Used  Substance Use Topics  . Alcohol use: Not Currently  . 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 [START ON 01/19/2020] ASPIRIN ADULT LOW STRENGTH 81 MG EC tablet Take 1 tablet (81 mg total) by mouth daily. 30 tablet 3  . atorvastatin (LIPITOR) 10 MG tablet Take 1 tablet (10 mg total) by mouth daily. 90 tablet 3  . metFORMIN (GLUCOPHAGE) 1000 MG tablet Take 1 tablet (1,000 mg total) by mouth 2 (two) times daily. 180 tablet 3  . amLODipine (NORVASC) 5 MG tablet Take 1 tablet (5 mg total) by mouth daily. 30 tablet 0   No current facility-administered medications for this visit.    Review of Systems Review of Systems  Constitutional: Negative.   HENT: Negative.  Negative for sinus pressure and sore throat.   Eyes: Negative.   Respiratory: Negative.   Cardiovascular: Negative.   Gastrointestinal: Negative.   Endocrine: Negative.  Genitourinary: Negative.   Musculoskeletal: Positive for arthralgias (right lower ankle pain).  Skin: Negative.   Neurological: Negative.   Hematological: Negative.   Psychiatric/Behavioral: Negative.     Blood pressure (!) 156/96, pulse 70, height 5\' 10"  (1.778 m), weight 185 lb (83.9 kg), SpO2 97 %.  Physical Exam Physical Exam Constitutional:      Appearance: Normal appearance.  HENT:     Head: Normocephalic and atraumatic.     Mouth/Throat:     Comments: Deferred per Covid protocol Eyes:     Extraocular Movements: Extraocular movements intact.     Pupils: Pupils are equal, round, and reactive to light.  Cardiovascular:     Rate and Rhythm: Normal  rate and regular rhythm.     Pulses: Normal pulses.     Heart sounds: Normal heart sounds.  Pulmonary:     Effort: Pulmonary effort is normal.     Breath sounds: Normal breath sounds.  Abdominal:     General: Abdomen is flat. Bowel sounds are normal.     Palpations: Abdomen is soft.  Genitourinary:    Comments: Deferred per patient Musculoskeletal:        General: Normal range of motion.     Cervical back: Normal range of motion.  Skin:    General: Skin is warm and dry.       Neurological:     General: No focal deficit present.     Mental Status: He is alert and oriented to person, place, and time.  Psychiatric:        Mood and Affect: Mood normal.        Behavior: Behavior normal.        Thought Content: Thought content normal.        Judgment: Judgment normal.     Data Reviewed Labs and medical history was reviewed.  Assessment and Plan  1. Type 2 diabetes mellitus without complication, without long-term current use of insulin (HCC) - His HgbA1c was 6.2%, he will continue on 1000 mg Metformin bid. -Use Diabetic diet as advised  -Check blood sugar 2-3 times a day, once before breakfast and others 2 hours after lunch or dinner -Write down the numbers against date in a log -Bring log to clinic every visit -Take medications regularly as advised -Regular exercise - POCT HgB A1C; Future - Urine Microalbumin w/creat. ratio; Future  2. Encounter to establish care  - Pneumococcal polysaccharide vaccine 23-valent greater than or equal to 2yo subcutaneous/IM; Future was administered  3. Elevated lipids - He will continue on current treatment regimen and - Lipid panel will be rechecked.  4. Right leg pain - He was advised to take 650 mg tylenol as needed and notify clinic for worsening pain.  5. Substance abuse (HCC) - He was provided with [facility] information for evaluation.  6. Toe pain, left - Discoloration and intermittent pain to 2nd toe of left foot. He was  encouraged to complete charity care application for - Ambulatory referral to Podiatry  7. Essential hypertension - His blood pressure was elevated, and will start on 5 mg Amlodipine. He was educated on medication side effects and advised to notify clinic. -Low salt DASH diet -Take medications regularly on time -Exercise regularly as tolerated -Check blood pressure at least once a week at home or a nearby pharmacy and record -Goal is less than 140/90 and normal blood pressure is less than 120/80 - ASPIRIN ADULT LOW STRENGTH 81 MG EC tablet; Take 1 tablet (81 mg total)  by mouth daily.  Dispense: 30 tablet; Refill: 3 - amLODipine (NORVASC) 5 MG tablet; Take 1 tablet (5 mg total) by mouth daily.  Dispense: 30 tablet; Refill: 0   Follow up: 02/02/2020 or if symptom worsens or fail to improve.  Missael Ferrari E Charnee Turnipseed 01/13/2020, 10:31 PM

## 2020-01-19 ENCOUNTER — Other Ambulatory Visit: Payer: Self-pay

## 2020-01-26 ENCOUNTER — Other Ambulatory Visit: Payer: Self-pay

## 2020-01-26 DIAGNOSIS — E119 Type 2 diabetes mellitus without complications: Secondary | ICD-10-CM

## 2020-01-26 DIAGNOSIS — E785 Hyperlipidemia, unspecified: Secondary | ICD-10-CM

## 2020-01-28 LAB — LIPID PANEL
Chol/HDL Ratio: 3.8 ratio (ref 0.0–5.0)
Cholesterol, Total: 202 mg/dL — ABNORMAL HIGH (ref 100–199)
HDL: 53 mg/dL (ref >39)
LDL Chol Calc (NIH): 131 mg/dL — ABNORMAL HIGH (ref 0–99)
Triglycerides: 101 mg/dL (ref 0–149)
VLDL Cholesterol Cal: 18 mg/dL (ref 5–40)

## 2020-01-28 LAB — MICROALBUMIN / CREATININE URINE RATIO
Creatinine, Urine: 269.6 mg/dL
Microalb/Creat Ratio: 2012 mg/g creat — ABNORMAL HIGH (ref 0–29)
Microalbumin, Urine: 5424.9 ug/mL

## 2020-01-28 LAB — HEMOGLOBIN A1C
Est. average glucose Bld gHb Est-mCnc: 134 mg/dL
Hgb A1c MFr Bld: 6.3 % — ABNORMAL HIGH (ref 4.8–5.6)

## 2020-02-02 ENCOUNTER — Ambulatory Visit: Payer: Self-pay | Admitting: Gerontology

## 2020-02-10 ENCOUNTER — Ambulatory Visit: Payer: Self-pay | Admitting: Gerontology

## 2020-02-10 ENCOUNTER — Other Ambulatory Visit: Payer: Self-pay

## 2020-02-10 DIAGNOSIS — I10 Essential (primary) hypertension: Secondary | ICD-10-CM

## 2020-02-10 MED ORDER — AMLODIPINE BESYLATE 5 MG PO TABS: 5.0000 mg | ORAL_TABLET | Freq: Every day | ORAL | 0 refills | Status: DC

## 2020-02-11 ENCOUNTER — Ambulatory Visit: Payer: Self-pay | Admitting: Pharmacy Technician

## 2020-02-11 ENCOUNTER — Telehealth: Payer: Self-pay | Admitting: Pharmacy Technician

## 2020-02-11 DIAGNOSIS — Z79899 Other long term (current) drug therapy: Secondary | ICD-10-CM

## 2020-02-11 NOTE — Progress Notes (Signed)
Completed [facility] application.  Patient to sign contract when picking-up medications.    Patient approved to receive medication assistance at [facility] until time for re-certification in 5790, and as long as eligibility criteria continues to be met.    [place] [facility]

## 2020-02-11 NOTE — Telephone Encounter (Signed)
Patient approved to receive medication assistance at [facility] until time for re-certification in 4730, and as long as eligibility criteria continues to be met.  [place] [facility]

## 2020-02-24 ENCOUNTER — Ambulatory Visit: Payer: Self-pay | Admitting: Gerontology

## 2020-02-24 ENCOUNTER — Other Ambulatory Visit: Payer: Self-pay

## 2020-02-24 ENCOUNTER — Encounter: Payer: Self-pay | Admitting: Gerontology

## 2020-02-24 VITALS — BP 136/87 | HR 70 | Ht 70.0 in | Wt 187.0 lb

## 2020-02-24 DIAGNOSIS — E119 Type 2 diabetes mellitus without complications: Secondary | ICD-10-CM

## 2020-02-24 DIAGNOSIS — I10 Essential (primary) hypertension: Secondary | ICD-10-CM

## 2020-02-24 DIAGNOSIS — E785 Hyperlipidemia, unspecified: Secondary | ICD-10-CM

## 2020-02-24 MED ORDER — LISINOPRIL 2.5 MG PO TABS: 2.5000 mg | ORAL_TABLET | Freq: Every day | ORAL | 0 refills | Status: DC

## 2020-02-24 NOTE — Patient Instructions (Signed)
   Managing Your Hypertension Hypertension is commonly called high blood pressure. This is when the force of your blood pressing against the walls of your arteries is too strong. Arteries are blood vessels that carry blood from your heart throughout your body. Hypertension forces the heart to work harder to pump blood, and may cause the arteries to become narrow or stiff. Having untreated or uncontrolled hypertension can cause heart attack, stroke, kidney disease, and other problems. What are blood pressure readings? A blood pressure reading consists of a higher number over a lower number. Ideally, your blood pressure should be below 120/80. The first ("top") number is called the systolic pressure. It is a measure of the pressure in your arteries as your heart beats. The second ("bottom") number is called the diastolic pressure. It is a measure of the pressure in your arteries as the heart relaxes. What does my blood pressure reading mean? Blood pressure is classified into four stages. Based on your blood pressure reading, your health care provider may use the following stages to determine what type of treatment you need, if any. Systolic pressure and diastolic pressure are measured in a unit called mm Hg. Normal  Systolic pressure: below 120.  Diastolic pressure: below 80. Elevated  Systolic pressure: 120-129.  Diastolic pressure: below 80. Hypertension stage 1  Systolic pressure: 130-139.  Diastolic pressure: 80-89. Hypertension stage 2  Systolic pressure: 140 or above.  Diastolic pressure: 90 or above. What health risks are associated with hypertension? Managing your hypertension is an important responsibility. Uncontrolled hypertension can lead to:  A heart attack.  A stroke.  A weakened blood vessel (aneurysm).  Heart failure.  Kidney damage.  Eye damage.  Metabolic syndrome.  Memory and concentration problems. What changes can I make to manage my  hypertension? Hypertension can be managed by making lifestyle changes and possibly by taking medicines. Your health care provider will help you make a plan to bring your blood pressure within a normal range. Eating and drinking   Eat a diet that is high in fiber and potassium, and low in salt (sodium), added sugar, and fat. An example eating plan is called the DASH (Dietary Approaches to Stop Hypertension) diet. To eat this way: ? Eat plenty of fresh fruits and vegetables. Try to fill half of your plate at each meal with fruits and vegetables. ? Eat whole grains, such as whole wheat pasta, brown rice, or whole grain bread. Fill about one quarter of your plate with whole grains. ? Eat low-fat diary products. ? Avoid fatty cuts of meat, processed or cured meats, and poultry with skin. Fill about one quarter of your plate with lean proteins such as fish, chicken without skin, beans, eggs, and tofu. ? Avoid premade and processed foods. These tend to be higher in sodium, added sugar, and fat.  Reduce your daily sodium intake. Most people with hypertension should eat less than 1,500 mg of sodium a day.  Limit alcohol intake to no more than 1 drink a day for nonpregnant women and 2 drinks a day for men. One drink equals 12 oz of beer, 5 oz of wine, or 1 oz of hard liquor. Lifestyle  Work with your health care provider to maintain a healthy body weight, or to lose weight. Ask what an ideal weight is for you.  Get at least 30 minutes of exercise that causes your heart to beat faster (aerobic exercise) most days of the week. Activities may include walking, swimming, or biking.    Include exercise to strengthen your muscles (resistance exercise), such as weight lifting, as part of your weekly exercise routine. Try to do these types of exercises for 30 minutes at least 3 days a week.  Do not use any products that contain nicotine or tobacco, such as cigarettes and e-cigarettes. If you need help quitting,  ask your health care provider.  Control any long-term (chronic) conditions you have, such as high cholesterol or diabetes. Monitoring  Monitor your blood pressure at home as told by your health care provider. Your personal target blood pressure may vary depending on your medical conditions, your age, and other factors.  Have your blood pressure checked regularly, as often as told by your health care provider. Working with your health care provider  Review all the medicines you take with your health care provider because there may be side effects or interactions.  Talk with your health care provider about your diet, exercise habits, and other lifestyle factors that may be contributing to hypertension.  Visit your health care provider regularly. Your health care provider can help you create and adjust your plan for managing hypertension. Will I need medicine to control my blood pressure? Your health care provider may prescribe medicine if lifestyle changes are not enough to get your blood pressure under control, and if:  Your systolic blood pressure is 130 or higher.  Your diastolic blood pressure is 80 or higher. Take medicines only as told by your health care provider. Follow the directions carefully. Blood pressure medicines must be taken as prescribed. The medicine does not work as well when you skip doses. Skipping doses also puts you at risk for problems. Contact a health care provider if:  You think you are having a reaction to medicines you have taken.  You have repeated (recurrent) headaches.  You feel dizzy.  You have swelling in your ankles.  You have trouble with your vision. Get help right away if:  You develop a severe headache or confusion.  You have unusual weakness or numbness, or you feel faint.  You have severe pain in your chest or abdomen.  You vomit repeatedly.  You have trouble breathing. Summary  Hypertension is when the force of blood pumping  through your arteries is too strong. If this condition is not controlled, it may put you at risk for serious complications.  Your personal target blood pressure may vary depending on your medical conditions, your age, and other factors. For most people, a normal blood pressure is less than 120/80.  Hypertension is managed by lifestyle changes, medicines, or both. Lifestyle changes include weight loss, eating a healthy, low-sodium diet, exercising more, and limiting alcohol. This information is not intended to replace advice given to you by your health care provider. Make sure you discuss any questions you have with your health care provider. Document Revised: 03/26/2019 Document Reviewed: 10/30/2016 Elsevier Patient Education  2020 Elsevier Inc.  

## 2020-02-24 NOTE — Progress Notes (Signed)
Established Patient Office Visit  Subjective:  Patient ID: Max Brown, male    DOB: [birth year]  Age: 54 y.o. MRN: [id number]  CC: No chief complaint on file.   HPI Max Brown presents for follow up of hypertension, type 2 diabetes and lab review. His HgbA1c done on 01/26/2020 was 6.3% and he states that he's compliant with his medications and checks his fasting blood glucose daily and his reading today was 97 mg/dl. He denies hypoglycemic symptoms, peripheral neuropathy and performs daily foot checks. His Lipid panel, total cholesterol was 202 mg/dl and LDL was 131 mg/dl, he continues taking 10 mg Atorvastatin and he denies myalgia and muscle weakness. His urine Microalbumin w/creat.ratio was 2,012. He reports that he will pick up his 5 mg Amlodipine from pharmacy today. He states that he smokes 1 cigarette daily and continues to use nicotine patch. Overall, he states that he's doing well and offers no further complaint.  Past Medical History:  Diagnosis Date  . Aneurysm ([facility])    left side of brain, 1997  . Diabetes mellitus without complication ([facility])   . Hepatitis C    pt states in 2009 diagnosed and received some meds for it, but has not gotten anymore  . Hypertension     No past surgical history on file.  No family history on file.  Social History   Socioeconomic History  . Marital status: Single    Spouse name: Not on file  . Number of children: Not on file  . Years of education: Not on file  . Highest education level: Not on file  Occupational History  . Not on file  Tobacco Use  . Smoking status: Current Some Day Smoker    Packs/day: 0.20    Years: 30.00    Pack years: 6.00    Types: Cigarettes  . Smokeless tobacco: Never Used  . Tobacco comment: currently using patches 02/24/2020  Substance and Sexual Activity  . Alcohol use: Not Currently  . Drug use: Not Currently    Types: Cocaine    Comment: pt was clean 8 years, relapsed today and had a few hits of  crack  . Sexual activity: Not on file  Other Topics Concern  . Not on file  Social History Narrative  . Not on file   Social Determinants of Health   Financial Resource Strain:   . Difficulty of Paying Living Expenses:   Food Insecurity:   . Worried About Charity fundraiser in the Last Year:   . Arboriculturist in the Last Year:   Transportation Needs:   . Film/video editor (Medical):   Marland Kitchen Lack of Transportation (Non-Medical):   Physical Activity:   . Days of Exercise per Week:   . Minutes of Exercise per Session:   Stress:   . Feeling of Stress :   Social Connections:   . Frequency of Communication with Friends and Family:   . Frequency of Social Gatherings with Friends and Family:   . Attends Religious Services:   . Active Member of Clubs or Organizations:   . Attends Archivist Meetings:   Marland Kitchen Marital Status:   Intimate Partner Violence:   . Fear of Current or Ex-Partner:   . Emotionally Abused:   Marland Kitchen Physically Abused:   . Sexually Abused:     Outpatient Medications Prior to Visit  Medication Sig Dispense Refill  . ASPIRIN ADULT LOW STRENGTH 81 MG EC tablet Take 1 tablet (81  mg total) by mouth daily. 30 tablet 3  . atorvastatin (LIPITOR) 10 MG tablet Take 1 tablet (10 mg total) by mouth daily. 90 tablet 3  . metFORMIN (GLUCOPHAGE) 1000 MG tablet Take 1 tablet (1,000 mg total) by mouth 2 (two) times daily. 180 tablet 3  . amLODipine (NORVASC) 5 MG tablet Take 1 tablet (5 mg total) by mouth daily. (Patient not taking: Reported on 02/24/2020) 30 tablet 0  . blood glucose meter kit and supplies KIT Dispense based on patient and insurance preference. Use up to four times daily as directed. (FOR ICD-9 250.00, 250.01). 1 each 0   No facility-administered medications prior to visit.    No Known Allergies  ROS Review of Systems  Constitutional: Negative.   Eyes: Negative.   Respiratory: Negative.   Cardiovascular: Negative.   Neurological: Negative.        Objective:    Physical Exam  Constitutional: He is oriented to person, place, and time. He appears well-developed.  HENT:  Head: Normocephalic and atraumatic.  Eyes: Pupils are equal, round, and reactive to light. EOM are normal.  Cardiovascular: Normal rate and regular rhythm.  Pulmonary/Chest: Effort normal and breath sounds normal.  Neurological: He is alert and oriented to person, place, and time.  Psychiatric: He has a normal mood and affect. His behavior is normal. Judgment and thought content normal.    BP 136/87 (BP Location: Left Arm, Patient Position: Sitting, Cuff Size: Large)   Pulse 70   Ht '5\' 10"'  (1.778 m)   Wt 187 lb (84.8 kg)   SpO2 97%   BMI 26.83 kg/m  Wt Readings from Last 3 Encounters:  02/24/20 187 lb (84.8 kg)  01/13/20 185 lb (83.9 kg)  11/22/19 194 lb (88 kg)     Health Maintenance Due  Topic Date Due  . Hepatitis C Screening  Never done  . PNEUMOCOCCAL POLYSACCHARIDE VACCINE AGE 81-64 HIGH RISK  Never done  . OPHTHALMOLOGY EXAM  Never done  . HIV Screening  Never done  . COLONOSCOPY  Never done    There are no preventive care reminders to display for this patient.  Lab Results  Component Value Date   TSH 1.440 01/23/2017   Lab Results  Component Value Date   WBC 4.9 11/22/2019   HGB 15.9 11/22/2019   HCT 48.7 11/22/2019   MCV 88.9 11/22/2019   PLT 199 11/22/2019   Lab Results  Component Value Date   NA 141 11/22/2019   K 3.7 11/22/2019   CO2 23 11/22/2019   GLUCOSE 170 (H) 11/22/2019   BUN 18 11/22/2019   CREATININE 0.99 11/22/2019   BILITOT 1.1 11/22/2019   ALKPHOS 100 11/22/2019   AST 19 11/22/2019   ALT 25 11/22/2019   PROT 7.1 11/22/2019   ALBUMIN 3.7 11/22/2019   CALCIUM 9.1 11/22/2019   ANIONGAP 9 11/22/2019   Lab Results  Component Value Date   CHOL 202 (H) 01/26/2020   Lab Results  Component Value Date   HDL 53 01/26/2020   Lab Results  Component Value Date   LDLCALC 131 (H) 01/26/2020   Lab Results   Component Value Date   TRIG 101 01/26/2020   Lab Results  Component Value Date   CHOLHDL 3.8 01/26/2020   Lab Results  Component Value Date   HGBA1C 6.3 (H) 01/26/2020      Assessment & Plan:   1. Type 2 diabetes mellitus without complication, without long-term current use of insulin (HCC) - His HgbA1c was  6.3%, and he will continue on current medication regimen. He was advised to continue on low carb/non concentrated sweet diet and exercise as tolerated.  2. Elevated lipids - He will continue on 10 mg Atorvastatin, will recheck Lipid panel. - He was advised to continue on Low fat Diet, like low fat dairy products eg skimmed milk -Avoid any fried food -Regular exercise/walk -Goal for Total Cholesterol is less than 200 -Goal for bad cholesterol LDL is less than 70 -Goal for Good cholesterol HDL is more than 45 -Goal for Triglyceride is less than 150    3. Essential hypertension - His blood pressure was normal when rechecked and he was encouraged to pick up Amlodipine from Pharmacy. He will start 2.5 mg Lisinopril for renal protection due to increased urine microalbumin creat ratio. He was educated on medication side effect and was advised to notify clinic. He was advised to check blood pressure, record and bring log to follow up appointment. - lisinopril (ZESTRIL) 2.5 MG tablet; Take 1 tablet (2.5 mg total) by mouth daily.  Dispense: 30 tablet; Refill: 0     Follow-up: Return in about 4 weeks (around 03/23/2020), or if symptoms worsen or fail to improve.    Alizey Noren Jerold Coombe, NP

## 2020-03-16 NOTE — Congregational Nurse Program (Signed)
Blood sugar 144. 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 He stated he has been feeling dpressed

## 2020-03-23 ENCOUNTER — Ambulatory Visit: Payer: Self-pay | Admitting: Gerontology

## 2020-03-29 ENCOUNTER — Ambulatory Visit: Payer: Self-pay | Admitting: Podiatry

## 2020-03-30 ENCOUNTER — Ambulatory Visit: Payer: Self-pay | Admitting: Podiatry

## 2020-03-30 NOTE — Congregational Nurse Program (Signed)
Vitals done. Assisted with foot care. No problems

## 2020-04-04 ENCOUNTER — Ambulatory Visit: Payer: Self-pay | Admitting: Gerontology

## 2020-04-04 ENCOUNTER — Other Ambulatory Visit: Payer: Self-pay

## 2020-04-04 ENCOUNTER — Encounter: Payer: Self-pay | Admitting: Gerontology

## 2020-04-04 VITALS — BP 133/91 | HR 71 | Temp 98.4°F | Ht 70.0 in | Wt 182.0 lb

## 2020-04-04 DIAGNOSIS — I10 Essential (primary) hypertension: Secondary | ICD-10-CM

## 2020-04-04 DIAGNOSIS — E119 Type 2 diabetes mellitus without complications: Secondary | ICD-10-CM

## 2020-04-04 DIAGNOSIS — E785 Hyperlipidemia, unspecified: Secondary | ICD-10-CM

## 2020-04-04 MED ORDER — LISINOPRIL 2.5 MG PO TABS: 2.5000 mg | ORAL_TABLET | Freq: Every day | ORAL | 3 refills | Status: DC

## 2020-04-04 MED ORDER — AMLODIPINE BESYLATE 5 MG PO TABS: 5.0000 mg | ORAL_TABLET | Freq: Every day | ORAL | 3 refills | Status: DC

## 2020-04-04 NOTE — Patient Instructions (Signed)
DASH Eating Plan DASH stands for "Dietary Approaches to Stop Hypertension." The DASH eating plan is a healthy eating plan that has been shown to reduce high blood pressure (hypertension). It may also reduce your risk for type 2 diabetes, heart disease, and stroke. The DASH eating plan may also help with weight loss. What are tips for following this plan?  General guidelines  Avoid eating more than 2,300 mg (milligrams) of salt (sodium) a day. If you have hypertension, you may need to reduce your sodium intake to 1,500 mg a day.  Limit alcohol intake to no more than 1 drink a day for nonpregnant women and 2 drinks a day for men. One drink equals 12 oz of beer, 5 oz of wine, or 1 oz of hard liquor.  Work with your health care provider to maintain a healthy body weight or to lose weight. Ask what an ideal weight is for you.  Get at least 30 minutes of exercise that causes your heart to beat faster (aerobic exercise) most days of the week. Activities may include walking, swimming, or biking.  Work with your health care provider or diet and nutrition specialist (dietitian) to adjust your eating plan to your individual calorie needs. Reading food labels   Check food labels for the amount of sodium per serving. Choose foods with less than 5 percent of the Daily Value of sodium. Generally, foods with less than 300 mg of sodium per serving fit into this eating plan.  To find whole grains, look for the word "whole" as the first word in the ingredient list. Shopping  Buy products labeled as "low-sodium" or "no salt added."  Buy fresh foods. Avoid canned foods and premade or frozen meals. Cooking  Avoid adding salt when cooking. Use salt-free seasonings or herbs instead of table salt or sea salt. Check with your health care provider or pharmacist before using salt substitutes.  Do not fry foods. Cook foods using healthy methods such as baking, boiling, grilling, and broiling instead.  Cook with  heart-healthy oils, such as olive, canola, soybean, or sunflower oil. Meal planning  Eat a balanced diet that includes: ? 5 or more servings of fruits and vegetables each day. At each meal, try to fill half of your plate with fruits and vegetables. ? Up to 6-8 servings of whole grains each day. ? Less than 6 oz of lean meat, poultry, or fish each day. A 3-oz serving of meat is about the same size as a deck of cards. One egg equals 1 oz. ? 2 servings of low-fat dairy each day. ? A serving of nuts, seeds, or beans 5 times each week. ? Heart-healthy fats. Healthy fats called Omega-3 fatty acids are found in foods such as flaxseeds and coldwater fish, like sardines, salmon, and mackerel.  Limit how much you eat of the following: ? Canned or prepackaged foods. ? Food that is high in trans fat, such as fried foods. ? Food that is high in saturated fat, such as fatty meat. ? Sweets, desserts, sugary drinks, and other foods with added sugar. ? Full-fat dairy products.  Do not salt foods before eating.  Try to eat at least 2 vegetarian meals each week.  Eat more home-cooked food and less restaurant, buffet, and fast food.  When eating at a restaurant, ask that your food be prepared with less salt or no salt, if possible. What foods are recommended? The items listed may not be a complete list. Talk with your dietitian about   what dietary choices are best for you. Grains Whole-grain or whole-wheat bread. Whole-grain or whole-wheat pasta. Brown rice. Oatmeal. Quinoa. Bulgur. Whole-grain and low-sodium cereals. Pita bread. Low-fat, low-sodium crackers. Whole-wheat flour tortillas. Vegetables Fresh or frozen vegetables (raw, steamed, roasted, or grilled). Low-sodium or reduced-sodium tomato and vegetable juice. Low-sodium or reduced-sodium tomato sauce and tomato paste. Low-sodium or reduced-sodium canned vegetables. Fruits All fresh, dried, or frozen fruit. Canned fruit in natural juice (without  added sugar). Meat and other protein foods Skinless chicken or turkey. Ground chicken or turkey. Pork with fat trimmed off. Fish and seafood. Egg whites. Dried beans, peas, or lentils. Unsalted nuts, nut butters, and seeds. Unsalted canned beans. Lean cuts of beef with fat trimmed off. Low-sodium, lean deli meat. Dairy Low-fat (1%) or fat-free (skim) milk. Fat-free, low-fat, or reduced-fat cheeses. Nonfat, low-sodium ricotta or cottage cheese. Low-fat or nonfat yogurt. Low-fat, low-sodium cheese. Fats and oils Soft margarine without trans fats. Vegetable oil. Low-fat, reduced-fat, or light mayonnaise and salad dressings (reduced-sodium). Canola, safflower, olive, soybean, and sunflower oils. Avocado. Seasoning and other foods Herbs. Spices. Seasoning mixes without salt. Unsalted popcorn and pretzels. Fat-free sweets. What foods are not recommended? The items listed may not be a complete list. Talk with your dietitian about what dietary choices are best for you. Grains Baked goods made with fat, such as croissants, muffins, or some breads. Dry pasta or rice meal packs. Vegetables Creamed or fried vegetables. Vegetables in a cheese sauce. Regular canned vegetables (not low-sodium or reduced-sodium). Regular canned tomato sauce and paste (not low-sodium or reduced-sodium). Regular tomato and vegetable juice (not low-sodium or reduced-sodium). Pickles. Olives. Fruits Canned fruit in a light or heavy syrup. Fried fruit. Fruit in cream or butter sauce. Meat and other protein foods Fatty cuts of meat. Ribs. Fried meat. Bacon. Sausage. Bologna and other processed lunch meats. Salami. Fatback. Hotdogs. Bratwurst. Salted nuts and seeds. Canned beans with added salt. Canned or smoked fish. Whole eggs or egg yolks. Chicken or turkey with skin. Dairy Whole or 2% milk, cream, and half-and-half. Whole or full-fat cream cheese. Whole-fat or sweetened yogurt. Full-fat cheese. Nondairy creamers. Whipped toppings.  Processed cheese and cheese spreads. Fats and oils Butter. Stick margarine. Lard. Shortening. Ghee. Bacon fat. Tropical oils, such as coconut, palm kernel, or palm oil. Seasoning and other foods Salted popcorn and pretzels. Onion salt, garlic salt, seasoned salt, table salt, and sea salt. Worcestershire sauce. Tartar sauce. Barbecue sauce. Teriyaki sauce. Soy sauce, including reduced-sodium. Steak sauce. Canned and packaged gravies. Fish sauce. Oyster sauce. Cocktail sauce. Horseradish that you find on the shelf. Ketchup. Mustard. Meat flavorings and tenderizers. Bouillon cubes. Hot sauce and Tabasco sauce. Premade or packaged marinades. Premade or packaged taco seasonings. Relishes. Regular salad dressings. Where to find more information:  National Heart, Lung, and Blood Institute: www.nhlbi.nih.gov  American Heart Association: www.heart.org Summary  The DASH eating plan is a healthy eating plan that has been shown to reduce high blood pressure (hypertension). It may also reduce your risk for type 2 diabetes, heart disease, and stroke.  With the DASH eating plan, you should limit salt (sodium) intake to 2,300 mg a day. If you have hypertension, you may need to reduce your sodium intake to 1,500 mg a day.  When on the DASH eating plan, aim to eat more fresh fruits and vegetables, whole grains, lean proteins, low-fat dairy, and heart-healthy fats.  Work with your health care provider or diet and nutrition specialist (dietitian) to adjust your eating plan to your   individual calorie needs. This information is not intended to replace advice given to you by your health care provider. Make sure you discuss any questions you have with your health care provider. Document Revised: 11/14/2017 Document Reviewed: 11/25/2016 Elsevier Patient Education  2020 Elsevier Inc.  

## 2020-04-04 NOTE — Progress Notes (Signed)
Established Patient Office Visit  Subjective:  Patient ID: Max Brown, male    DOB: [birth year]  Age: 54 y.o. MRN: [id number]  CC:  Chief Complaint  Patient presents with  . Diabetes    HPI ZAYON TRULSON presents for follow up of hypertension and medication refill. He states that he's compliant with his medication, adheres to DASH diet and exercises as tolerated. He states that he checks his blood pressure twice weekly and it's usually less than 140/90. His urine micro albumin creat ratio was 2,012 and he was started on 2.5 mg Lisinopril daily. He denies cough, chest pain, palpitation, light headedness, myalgia and peripheral edema. Overall, he states that he's doing well and offers no further complaint.  Past Medical History:  Diagnosis Date  . 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 Diabetes mellitus without complication ([place])   . Hepatitis C    pt states in 2009 diagnosed and received some meds for it, but has not gotten anymore  . Hypertension     No past surgical history on file.  No family history on file.  Social History   Socioeconomic History  . Marital status: Single    Spouse name: Not on file  . Number of children: Not on file  . Years of education: Not on file  . Highest education level: Not on file  Occupational History  . Occupation: unemployed  Tobacco Use  . Smoking status: Current Some Day Smoker    Packs/day: 0.20    Years: 30.00    Pack years: 6.00    Types: Cigarettes  . Smokeless tobacco: Never Used  . Tobacco comment: currently using patches 02/24/2020  Substance and Sexual Activity  . Alcohol use: Not Currently  . 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 Sexual activity: Not on file  Other Topics Concern  . Not on file  Social History Narrative  . Not on file   Social Determinants of Health   Financial Resource Strain:   . Difficulty of Paying  Living Expenses:   Food Insecurity:   . Worried About Charity fundraiser in the Last Year:   . Arboriculturist in the Last Year:   Transportation Needs:   . Film/video editor (Medical):   Marland Kitchen Lack of Transportation (Non-Medical):   Physical Activity:   . Days of Exercise per Week:   . Minutes of Exercise per Session:   Stress:   . Feeling of Stress :   Social Connections:   . Frequency of Communication with Friends and Family:   . Frequency of Social Gatherings with Friends and Family:   . Attends Religious Services:   . Active Member of Clubs or Organizations:   . Attends Archivist Meetings:   Marland Kitchen Marital Status:   Intimate Partner Violence:   . Fear of Current or Ex-Partner:   . Emotionally Abused:   Marland Kitchen Physically Abused:   . Sexually Abused:     Outpatient Medications Prior to Visit  Medication Sig Dispense Refill  . ASPIRIN ADULT LOW STRENGTH 81 MG EC tablet Take 1 tablet (81 mg total) by mouth daily. 30 tablet 3  . atorvastatin (LIPITOR) 10 MG tablet Take 1 tablet (10 mg total) by mouth daily. 90 tablet 3  . metFORMIN (GLUCOPHAGE) 1000 MG tablet Take 1 tablet (1,000  mg total) by mouth 2 (two) times daily. 180 tablet 3  . amLODipine (NORVASC) 5 MG tablet Take 1 tablet (5 mg total) by mouth daily. 30 tablet 0  . lisinopril (ZESTRIL) 2.5 MG tablet Take 1 tablet (2.5 mg total) by mouth daily. 30 tablet 0  . blood glucose meter kit and supplies KIT Dispense based on patient and insurance preference. Use up to four times daily as directed. (FOR ICD-9 250.00, 250.01). 1 each 0   No facility-administered medications prior to visit.    No Known Allergies  ROS Review of Systems  Constitutional: Negative.   Eyes: Negative.   Respiratory: Negative.   Cardiovascular: Negative.   Neurological: Negative.   Psychiatric/Behavioral: Negative.       Objective:    Physical Exam  Constitutional: He is oriented to person, place, and time. He appears well-developed.   HENT:  Head: Normocephalic and atraumatic.  Eyes: Pupils are equal, round, and reactive to light. EOM are normal.  Cardiovascular: Normal rate and regular rhythm.  Pulmonary/Chest: Effort normal and breath sounds normal.  Neurological: He is alert and oriented to person, place, and time.  Psychiatric: He has a normal mood and affect. His behavior is normal. Judgment and thought content normal.    BP (!) 133/91 (BP Location: Right Arm, Patient Position: Sitting)   Pulse 71   Temp 98.4 F (36.9 C)   Ht '5\' 10"'  (1.778 m)   Wt 182 lb (82.6 kg)   SpO2 100%   BMI 26.11 kg/m  Wt Readings from Last 3 Encounters:  04/04/20 182 lb (82.6 kg)  02/24/20 187 lb (84.8 kg)  01/13/20 185 lb (83.9 kg)     Health Maintenance Due  Topic Date Due  . Hepatitis C Screening  Never done  . PNEUMOCOCCAL POLYSACCHARIDE VACCINE AGE 51-64 HIGH RISK  Never done  . OPHTHALMOLOGY EXAM  Never done  . HIV Screening  Never done  . COLONOSCOPY  Never done    There are no preventive care reminders to display for this patient.  Lab Results  Component Value Date   TSH 1.440 01/23/2017   Lab Results  Component Value Date   WBC 4.9 11/22/2019   HGB 15.9 11/22/2019   HCT 48.7 11/22/2019   MCV 88.9 11/22/2019   PLT 199 11/22/2019   Lab Results  Component Value Date   NA 141 11/22/2019   K 3.7 11/22/2019   CO2 23 11/22/2019   GLUCOSE 170 (H) 11/22/2019   BUN 18 11/22/2019   CREATININE 0.99 11/22/2019   BILITOT 1.1 11/22/2019   ALKPHOS 100 11/22/2019   AST 19 11/22/2019   ALT 25 11/22/2019   PROT 7.1 11/22/2019   ALBUMIN 3.7 11/22/2019   CALCIUM 9.1 11/22/2019   ANIONGAP 9 11/22/2019   Lab Results  Component Value Date   CHOL 202 (H) 01/26/2020   Lab Results  Component Value Date   HDL 53 01/26/2020   Lab Results  Component Value Date   LDLCALC 131 (H) 01/26/2020   Lab Results  Component Value Date   TRIG 101 01/26/2020   Lab Results  Component Value Date   CHOLHDL 3.8  01/26/2020   Lab Results  Component Value Date   HGBA1C 6.3 (H) 01/26/2020      Assessment & Plan:    1. Essential hypertension - His blood pressure is under control, he will continue on current medication regimen. His goal blood pressure should be less than 140/90. He was advised to continue on DASH  diet, exercise as tolerated. - amLODipine (NORVASC) 5 MG tablet; Take 1 tablet (5 mg total) by mouth daily.  Dispense: 30 tablet; Refill: 3 - lisinopril (ZESTRIL) 2.5 MG tablet; Take 1 tablet (2.5 mg total) by mouth daily.  Dispense: 30 tablet; Refill: 3  2. Type 2 diabetes mellitus without complication, without long-term current use of insulin ([place]) - Will recheck lab and if urine urine microalbumin w/creat ration is high, will refer to Nephrology. Was advised to increase water intake. - Urine Microalbumin w/creat. ratio; Future - HgB A1c; Future  3. Elevated lipids - He will continue on current treatment regimen, advised to continue on low fat/ low cholesterol diet. - Lipid panel; Future    Follow-up: Return in about 7 weeks (around 05/24/2020), or if symptoms worsen or fail to improve.    Klinton Candelas Jerold Coombe, NP

## 2020-04-11 DIAGNOSIS — E119 Type 2 diabetes mellitus without complications: Secondary | ICD-10-CM

## 2020-04-11 LAB — GLUCOSE, POCT (MANUAL RESULT ENTRY): POC Glucose: 207 mg/dl — AB (ref 70–99)

## 2020-04-11 NOTE — Congregational Nurse Program (Signed)
Client was able to obtain his physical for his DOD card. He is out of his B/P medication but it was called to medication management. I will pick it up today. He has been mowing lawns and had a small cut to his right thumb area which is scabbed over. No swelling, reddness or drainage. Area cleansed, neosporin applied and covered with a bandaid. His non-fasting blood sugar was 207. No other complaints.

## 2020-04-13 DIAGNOSIS — E119 Type 2 diabetes mellitus without complications: Secondary | ICD-10-CM

## 2020-04-13 LAB — GLUCOSE, POCT (MANUAL RESULT ENTRY): POC Glucose: 150 mg/dl — AB (ref 70–99)

## 2020-04-13 NOTE — Congregational Nurse Program (Signed)
Client feeling good today. Blood sugar 150. I gave client a touch one glucometer to check his blood sugars. He received his blood pressure medications and will soon to out of Metformin. Medication management notified.

## 2020-04-27 DIAGNOSIS — E119 Type 2 diabetes mellitus without complications: Secondary | ICD-10-CM

## 2020-04-27 LAB — GLUCOSE, POCT (MANUAL RESULT ENTRY): POC Glucose: 131 mg/dl — AB (ref 70–99)

## 2020-04-27 NOTE — Congregational Nurse Program (Signed)
Client in with complaints of a blister on his right heel. Area discolored but not open. Salve applied with a bandaid as a cover dressing. He stated he was wearing shoes that were too big and it rubbed on his heel. He is diabetic so will monitor. Bandaids given to him to keep on .

## 2020-05-02 DIAGNOSIS — E119 Type 2 diabetes mellitus without complications: Secondary | ICD-10-CM

## 2020-05-02 LAB — GLUCOSE, POCT (MANUAL RESULT ENTRY): POC Glucose: 144 mg/dl — AB (ref 70–99)

## 2020-05-02 NOTE — Congregational Nurse Program (Signed)
Client has no complaints. He had not eaten this morning so food was provided. He has had good blood sugar control and is taking his medication.

## 2020-05-09 NOTE — Congregational Nurse Program (Signed)
Client in to have his blood pressure checked. Her refused his blood sugar. I assisted him with calling social services for another food stamp card.

## 2020-05-17 ENCOUNTER — Other Ambulatory Visit: Payer: Self-pay

## 2020-05-24 ENCOUNTER — Other Ambulatory Visit: Payer: Self-pay

## 2020-05-25 ENCOUNTER — Ambulatory Visit: Payer: Self-pay | Admitting: Gerontology

## 2020-06-01 NOTE — Congregational Nurse Program (Signed)
Client in with c/o his toenails. Foot care done and his toenails cut. Assessed both feet and no others problems noted.

## 2020-06-15 NOTE — Congregational Nurse Program (Signed)
Client in with c/o of a bad tooth on his left side. He has missed appointments he had previously with [facility] but set up another appointment for labs for 06-21-2020 at 1215 and f/u with MD on 06-29-20 at 3pm. Assisted client with a dental application. Blood sugar checked and noted 124. He had just eaten lunch.

## 2020-06-20 NOTE — Congregational Nurse Program (Signed)
Client for vital sign monitoring. Tooth ache has eased up a bit. Dental application taken to [facility]. Reminded Mateen of his lab work appointment tomorrow.

## 2020-06-21 ENCOUNTER — Other Ambulatory Visit: Payer: Self-pay

## 2020-06-21 DIAGNOSIS — E119 Type 2 diabetes mellitus without complications: Secondary | ICD-10-CM

## 2020-06-21 DIAGNOSIS — E785 Hyperlipidemia, unspecified: Secondary | ICD-10-CM

## 2020-06-22 LAB — LIPID PANEL
Chol/HDL Ratio: 4.9 ratio (ref 0.0–5.0)
Cholesterol, Total: 244 mg/dL — ABNORMAL HIGH (ref 100–199)
HDL: 50 mg/dL (ref >39)
LDL Chol Calc (NIH): 181 mg/dL — ABNORMAL HIGH (ref 0–99)
Triglycerides: 74 mg/dL (ref 0–149)
VLDL Cholesterol Cal: 13 mg/dL (ref 5–40)

## 2020-06-22 LAB — MICROALBUMIN / CREATININE URINE RATIO
Creatinine, Urine: 275.2 mg/dL
Microalb/Creat Ratio: 548 mg/g creat — ABNORMAL HIGH (ref 0–29)
Microalbumin, Urine: 1508.4 ug/mL

## 2020-06-22 LAB — HEMOGLOBIN A1C
Est. average glucose Bld gHb Est-mCnc: 131 mg/dL
Hgb A1c MFr Bld: 6.2 % — ABNORMAL HIGH (ref 4.8–5.6)

## 2020-06-29 ENCOUNTER — Ambulatory Visit: Payer: Self-pay | Admitting: Gerontology

## 2020-06-29 NOTE — Congregational Nurse Program (Signed)
Client in for assistance in obtaining a dental appointment. He has a tooth that has lost the filling. Today  [facility]. Appointment made for August 16th at 8am. Reviewed his recent labs with him.

## 2020-07-04 NOTE — Congregational Nurse Program (Signed)
Client in to have V/S monitored. He states he did not make it to his appointment last week at [facility] because they changed his time and he couldn't get the bus. No complaints or concerns.

## 2020-07-27 DIAGNOSIS — E119 Type 2 diabetes mellitus without complications: Secondary | ICD-10-CM

## 2020-07-27 LAB — GLUCOSE, POCT (MANUAL RESULT ENTRY): POC Glucose: 173 mg/dl — AB (ref 70–99)

## 2020-07-27 NOTE — Congregational Nurse Program (Signed)
Client in for vital siigns and B/S which was 173. He stated he had ate cookies this morning. 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

## 2020-08-07 ENCOUNTER — Ambulatory Visit: Payer: Self-pay

## 2020-08-08 NOTE — Congregational Nurse Program (Signed)
Tejon in to have his B/S checked which was 105.He had not eaten yet. He has an interview for a job this afternoon and he is hopeful. He had a concern with the second toe on his left foot throbbing, an assessment was done. The toenail is thick and discolored but no open areas were found. I placed a Band-Aid on it for comfort and will continue to monitor it.

## 2020-08-15 ENCOUNTER — Ambulatory Visit: Payer: Self-pay | Admitting: Gerontology

## 2020-08-28 ENCOUNTER — Ambulatory Visit: Payer: Self-pay

## 2020-08-30 ENCOUNTER — Other Ambulatory Visit: Payer: Self-pay

## 2020-08-30 ENCOUNTER — Other Ambulatory Visit: Payer: Self-pay | Admitting: Gerontology

## 2020-08-30 ENCOUNTER — Encounter: Payer: Self-pay | Admitting: Gerontology

## 2020-08-30 ENCOUNTER — Ambulatory Visit: Payer: Self-pay | Admitting: Gerontology

## 2020-08-30 VITALS — BP 127/85 | HR 75 | Ht 70.0 in | Wt 172.0 lb

## 2020-08-30 DIAGNOSIS — Z0000 Encounter for general adult medical examination without abnormal findings: Secondary | ICD-10-CM | POA: Insufficient documentation

## 2020-08-30 DIAGNOSIS — G8929 Other chronic pain: Secondary | ICD-10-CM

## 2020-08-30 DIAGNOSIS — I10 Essential (primary) hypertension: Secondary | ICD-10-CM

## 2020-08-30 DIAGNOSIS — M25561 Pain in right knee: Secondary | ICD-10-CM | POA: Insufficient documentation

## 2020-08-30 DIAGNOSIS — M79675 Pain in left toe(s): Secondary | ICD-10-CM

## 2020-08-30 MED ORDER — AMLODIPINE BESYLATE 5 MG PO TABS: 5.0000 mg | ORAL_TABLET | Freq: Every day | ORAL | 3 refills | Status: DC

## 2020-08-30 MED ORDER — LISINOPRIL 2.5 MG PO TABS: 2.5000 mg | ORAL_TABLET | Freq: Every day | ORAL | 3 refills | Status: AC

## 2020-08-30 NOTE — Patient Instructions (Signed)
DASH Eating Plan DASH stands for "Dietary Approaches to Stop Hypertension." The DASH eating plan is a healthy eating plan that has been shown to reduce high blood pressure (hypertension). It may also reduce your risk for type 2 diabetes, heart disease, and stroke. The DASH eating plan may also help with weight loss. What are tips for following this plan?  General guidelines  Avoid eating more than 2,300 mg (milligrams) of salt (sodium) a day. If you have hypertension, you may need to reduce your sodium intake to 1,500 mg a day.  Limit alcohol intake to no more than 1 drink a day for nonpregnant women and 2 drinks a day for men. One drink equals 12 oz of beer, 5 oz of wine, or 1 oz of hard liquor.  Work with your health care provider to maintain a healthy body weight or to lose weight. Ask what an ideal weight is for you.  Get at least 30 minutes of exercise that causes your heart to beat faster (aerobic exercise) most days of the week. Activities may include walking, swimming, or biking.  Work with your health care provider or diet and nutrition specialist (dietitian) to adjust your eating plan to your individual calorie needs. Reading food labels   Check food labels for the amount of sodium per serving. Choose foods with less than 5 percent of the Daily Value of sodium. Generally, foods with less than 300 mg of sodium per serving fit into this eating plan.  To find whole grains, look for the word "whole" as the first word in the ingredient list. Shopping  Buy products labeled as "low-sodium" or "no salt added."  Buy fresh foods. Avoid canned foods and premade or frozen meals. Cooking  Avoid adding salt when cooking. Use salt-free seasonings or herbs instead of table salt or sea salt. Check with your health care provider or pharmacist before using salt substitutes.  Do not fry foods. Cook foods using healthy methods such as baking, boiling, grilling, and broiling instead.  Cook with  heart-healthy oils, such as olive, canola, soybean, or sunflower oil. Meal planning  Eat a balanced diet that includes: ? 5 or more servings of fruits and vegetables each day. At each meal, try to fill half of your plate with fruits and vegetables. ? Up to 6-8 servings of whole grains each day. ? Less than 6 oz of lean meat, poultry, or fish each day. A 3-oz serving of meat is about the same size as a deck of cards. One egg equals 1 oz. ? 2 servings of low-fat dairy each day. ? A serving of nuts, seeds, or beans 5 times each week. ? Heart-healthy fats. Healthy fats called Omega-3 fatty acids are found in foods such as flaxseeds and coldwater fish, like sardines, salmon, and mackerel.  Limit how much you eat of the following: ? Canned or prepackaged foods. ? Food that is high in trans fat, such as fried foods. ? Food that is high in saturated fat, such as fatty meat. ? Sweets, desserts, sugary drinks, and other foods with added sugar. ? Full-fat dairy products.  Do not salt foods before eating.  Try to eat at least 2 vegetarian meals each week.  Eat more home-cooked food and less restaurant, buffet, and fast food.  When eating at a restaurant, ask that your food be prepared with less salt or no salt, if possible. What foods are recommended? The items listed may not be a complete list. Talk with your dietitian about   what dietary choices are best for you. Grains Whole-grain or whole-wheat bread. Whole-grain or whole-wheat pasta. Brown rice. Oatmeal. Quinoa. Bulgur. Whole-grain and low-sodium cereals. Pita bread. Low-fat, low-sodium crackers. Whole-wheat flour tortillas. Vegetables Fresh or frozen vegetables (raw, steamed, roasted, or grilled). Low-sodium or reduced-sodium tomato and vegetable juice. Low-sodium or reduced-sodium tomato sauce and tomato paste. Low-sodium or reduced-sodium canned vegetables. Fruits All fresh, dried, or frozen fruit. Canned fruit in natural juice (without  added sugar). Meat and other protein foods Skinless chicken or turkey. Ground chicken or turkey. Pork with fat trimmed off. Fish and seafood. Egg whites. Dried beans, peas, or lentils. Unsalted nuts, nut butters, and seeds. Unsalted canned beans. Lean cuts of beef with fat trimmed off. Low-sodium, lean deli meat. Dairy Low-fat (1%) or fat-free (skim) milk. Fat-free, low-fat, or reduced-fat cheeses. Nonfat, low-sodium ricotta or cottage cheese. Low-fat or nonfat yogurt. Low-fat, low-sodium cheese. Fats and oils Soft margarine without trans fats. Vegetable oil. Low-fat, reduced-fat, or light mayonnaise and salad dressings (reduced-sodium). Canola, safflower, olive, soybean, and sunflower oils. Avocado. Seasoning and other foods Herbs. Spices. Seasoning mixes without salt. Unsalted popcorn and pretzels. Fat-free sweets. What foods are not recommended? The items listed may not be a complete list. Talk with your dietitian about what dietary choices are best for you. Grains Baked goods made with fat, such as croissants, muffins, or some breads. Dry pasta or rice meal packs. Vegetables Creamed or fried vegetables. Vegetables in a cheese sauce. Regular canned vegetables (not low-sodium or reduced-sodium). Regular canned tomato sauce and paste (not low-sodium or reduced-sodium). Regular tomato and vegetable juice (not low-sodium or reduced-sodium). Pickles. Olives. Fruits Canned fruit in a light or heavy syrup. Fried fruit. Fruit in cream or butter sauce. Meat and other protein foods Fatty cuts of meat. Ribs. Fried meat. Bacon. Sausage. Bologna and other processed lunch meats. Salami. Fatback. Hotdogs. Bratwurst. Salted nuts and seeds. Canned beans with added salt. Canned or smoked fish. Whole eggs or egg yolks. Chicken or turkey with skin. Dairy Whole or 2% milk, cream, and half-and-half. Whole or full-fat cream cheese. Whole-fat or sweetened yogurt. Full-fat cheese. Nondairy creamers. Whipped toppings.  Processed cheese and cheese spreads. Fats and oils Butter. Stick margarine. Lard. Shortening. Ghee. Bacon fat. Tropical oils, such as coconut, palm kernel, or palm oil. Seasoning and other foods Salted popcorn and pretzels. Onion salt, garlic salt, seasoned salt, table salt, and sea salt. Worcestershire sauce. Tartar sauce. Barbecue sauce. Teriyaki sauce. Soy sauce, including reduced-sodium. Steak sauce. Canned and packaged gravies. Fish sauce. Oyster sauce. Cocktail sauce. Horseradish that you find on the shelf. Ketchup. Mustard. Meat flavorings and tenderizers. Bouillon cubes. Hot sauce and Tabasco sauce. Premade or packaged marinades. Premade or packaged taco seasonings. Relishes. Regular salad dressings. Where to find more information:  National Heart, Lung, and Blood Institute: www.nhlbi.nih.gov  American Heart Association: www.heart.org Summary  The DASH eating plan is a healthy eating plan that has been shown to reduce high blood pressure (hypertension). It may also reduce your risk for type 2 diabetes, heart disease, and stroke.  With the DASH eating plan, you should limit salt (sodium) intake to 2,300 mg a day. If you have hypertension, you may need to reduce your sodium intake to 1,500 mg a day.  When on the DASH eating plan, aim to eat more fresh fruits and vegetables, whole grains, lean proteins, low-fat dairy, and heart-healthy fats.  Work with your health care provider or diet and nutrition specialist (dietitian) to adjust your eating plan to your   individual calorie needs. This information is not intended to replace advice given to you by your health care provider. Make sure you discuss any questions you have with your health care provider. Document Revised: 11/14/2017 Document Reviewed: 11/25/2016 Elsevier Patient Education  2020 Elsevier Inc.  

## 2020-08-30 NOTE — Progress Notes (Signed)
Established Patient Office Visit  Subjective:  Patient ID: Max Brown, male    DOB: January 23, 1966  Age: 54 y.o. MRN: [id number]  CC:  Chief Complaint  Patient presents with  . Hypertension    HPI Max Brown presents for follow up of his hypertension, R knee pain, and L toe pain. He reports that he is compliant with his medication, but ran out of his lisinopril 2-3 weeks ago. Endorses following the DASH diet and exercising as tolerated. He reports that he checks his blood pressure daily with the congregational nurse and it is usually 130s/90s. He also reports R knee stiffness and aching pain that started more than a month ago. States that the pain worsens after walking and is eased when he elevates his leg. Denies using any medication for it. Rates the pain 7/10. Patient also reports a throbbing pain in his L second toe x 1 month. Also reports that it has darkened in color in the last month. He reports that the pain is worse at night. States that nothing makes the pain worse or better. Rates the pain 4/10. He denies chest pain, cough, shortness of breath, dizziiness, or peripheral edema. States that his health is overall well and has no further complaints.   Past Medical History:  Diagnosis Date  . 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 Diabetes mellitus without complication ([place])   . Hepatitis C    pt states in 2009 diagnosed and received some meds for it, but has not gotten anymore  . Hypertension     No past surgical history on file.  No family history on file.  Social History   Socioeconomic History  . Marital status: Single    Spouse name: Not on file  . Number of children: Not on file  . Years of education: Not on file  . Highest education level: Not on file  Occupational History  . Occupation: unemployed  Tobacco Use  . Smoking status: Current Some Day Smoker    Packs/day: 0.20    Years: 30.00    Pack years: 6.00    Types: Cigarettes  . Smokeless  tobacco: Never Used  . Tobacco comment: currently using patches 02/24/2020  Vaping Use  . Vaping Use: Never used  Substance and Sexual Activity  . Alcohol use: Not Currently  . 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 Sexual activity: Not on file  Other Topics Concern  . Not on file  Social History Narrative  . Not on file   Social Determinants of Health   Financial Resource Strain: Low Risk   . Difficulty of Paying Living Expenses: Not hard at all  Food Insecurity: No Food Insecurity  . Worried About Charity fundraiser in the Last Year: Never true  . Ran Out of Food in the Last Year: Never true  Transportation Needs: Unmet Transportation Needs  . Lack of Transportation (Medical): Yes  . Lack of Transportation (Non-Medical): Yes  Physical Activity: Sufficiently Active  . Days of Exercise per Week: 7 days  . Minutes of Exercise per Session: 60 min  Stress: Stress Concern Present  . Feeling of Stress : To some extent  Social Connections: Socially Isolated  . Frequency of Communication with Friends and Family: Twice a week  . Frequency of Social Gatherings with Friends and Family: More  than three times a week  . Attends Religious Services: Never  . Active Member of Clubs or Organizations: No  . Attends Archivist Meetings: Never  . Marital Status: Never married  Intimate Partner Violence: Not At Risk  . Fear of Current or Ex-Partner: No  . Emotionally Abused: No  . Physically Abused: No  . Sexually Abused: No    Outpatient Medications Prior to Visit  Medication Sig Dispense Refill  . ASPIRIN ADULT LOW STRENGTH 81 MG EC tablet Take 1 tablet (81 mg total) by mouth daily. 30 tablet 3  . atorvastatin (LIPITOR) 10 MG tablet Take 1 tablet (10 mg total) by mouth daily. 90 tablet 3  . metFORMIN (GLUCOPHAGE) 1000 MG tablet Take 1 tablet (1,000 mg total) by mouth 2 (two) times daily. 180  tablet 3  . amLODipine (NORVASC) 5 MG tablet Take 1 tablet (5 mg total) by mouth daily. 30 tablet 3  . lisinopril (ZESTRIL) 2.5 MG tablet Take 1 tablet (2.5 mg total) by mouth daily. 30 tablet 3  . blood glucose meter kit and supplies KIT Dispense based on patient and insurance preference. Use up to four times daily as directed. (FOR ICD-9 250.00, 250.01). 1 each 0   No facility-administered medications prior to visit.    No Known Allergies  ROS Review of Systems  Constitutional: Negative for fatigue and fever.  Respiratory: Negative.  Negative for cough, chest tightness and shortness of breath.   Cardiovascular: Negative.  Negative for chest pain, palpitations and leg swelling.  Endocrine: Negative for polydipsia, polyphagia and polyuria.  Musculoskeletal: Positive for arthralgias.       R knee pain x1 month. L second toe throbbing x1 month  Skin: Positive for color change.       Left second toe darker than other toes x 1 month  Neurological: Negative.  Negative for dizziness, light-headedness and numbness.      Objective:    Physical Exam Cardiovascular:     Rate and Rhythm: Normal rate and regular rhythm.     Heart sounds: Normal heart sounds.  Pulmonary:     Effort: Pulmonary effort is normal.     Breath sounds: Normal breath sounds.  Musculoskeletal:        General: Tenderness present. Normal range of motion.     Comments: R knee pain and stiffness x1 month. Full ROM, no swelling noted. Tenderness present to L second toe. Patient states that his toe has been throbbing for more than a month. Nail is overgrown and thick. Toe is darker in color than the rest of his foot. Patient reports full ROM and denies numbness in his toe.  Skin:    General: Skin is warm and dry.     Capillary Refill: Capillary refill takes less than 2 seconds.  Neurological:     Mental Status: He is alert.     BP 127/85 (BP Location: Right Arm, Patient Position: Sitting)   Pulse 75   Ht '5\' 10"'   (1.778 m)   Wt 172 lb (78 kg)   SpO2 97%   BMI 24.68 kg/m  Wt Readings from Last 3 Encounters:  08/30/20 172 lb (78 kg)  04/04/20 182 lb (82.6 kg)  02/24/20 187 lb (84.8 kg)     Health Maintenance Due  Topic Date Due  . Hepatitis C Screening  Never done  . PNEUMOCOCCAL POLYSACCHARIDE VACCINE AGE 38-64 HIGH RISK  Never done  . OPHTHALMOLOGY EXAM  Never done  . HIV Screening  Never  done  . COLONOSCOPY  Never done  . INFLUENZA VACCINE  07/16/2020    There are no preventive care reminders to display for this patient.  Lab Results  Component Value Date   TSH 1.440 01/23/2017   Lab Results  Component Value Date   WBC 4.9 11/22/2019   HGB 15.9 11/22/2019   HCT 48.7 11/22/2019   MCV 88.9 11/22/2019   PLT 199 11/22/2019   Lab Results  Component Value Date   NA 141 11/22/2019   K 3.7 11/22/2019   CO2 23 11/22/2019   GLUCOSE 170 (H) 11/22/2019   BUN 18 11/22/2019   CREATININE 0.99 11/22/2019   BILITOT 1.1 11/22/2019   ALKPHOS 100 11/22/2019   AST 19 11/22/2019   ALT 25 11/22/2019   PROT 7.1 11/22/2019   ALBUMIN 3.7 11/22/2019   CALCIUM 9.1 11/22/2019   ANIONGAP 9 11/22/2019   Lab Results  Component Value Date   CHOL 244 (H) 06/21/2020   Lab Results  Component Value Date   HDL 50 06/21/2020   Lab Results  Component Value Date   LDLCALC 181 (H) 06/21/2020   Lab Results  Component Value Date   TRIG 74 06/21/2020   Lab Results  Component Value Date   CHOLHDL 4.9 06/21/2020   Lab Results  Component Value Date   HGBA1C 6.2 (H) 06/21/2020      Assessment & Plan:   1. Essential hypertension Blood pressure was normal at 127/85 in clinic. He reports compliance with his medications, but states that he ran out of his lisinopril 2 weeks ago. He states that he gets his blood pressure checked daily with the congregational nurse at [facility]. He was encouraged to keep a log of these blood pressure readings and bring it to the next clinic visit. Will  continue on current medication and educated on DASH diet. - amLODipine (NORVASC) 5 MG tablet; Take 1 tablet (5 mg total) by mouth daily.  Dispense: 30 tablet; Refill: 3 - lisinopril (ZESTRIL) 2.5 MG tablet; Take 1 tablet (2.5 mg total) by mouth daily.  Dispense: 30 tablet; Refill: 3  2. Toe pain, left Monitor for further changes and call the clinic for increased pain or decreased sensation. - Ambulatory referral to Podiatry  3. Chronic pain of right knee Monitor for swelling or increased pain. Will schedule an appointment with Dr. Vickki Hearing in clinic.   4. Health care maintenance - Ambulatory referral to Gastroenterology for colonoscopy - Ambulatory referral to Ophthalmology - HgB A1c; Future - Lipid panel; Future - Comp Met (CMET); Future - CBC w/Diff; Future - Urinalysis; Future   Meds ordered this encounter  Medications  . amLODipine (NORVASC) 5 MG tablet    Sig: Take 1 tablet (5 mg total) by mouth daily.    Dispense:  30 tablet    Refill:  3  . lisinopril (ZESTRIL) 2.5 MG tablet    Sig: Take 1 tablet (2.5 mg total) by mouth daily.    Dispense:  30 tablet    Refill:  3     Follow-up: Return in about 4 weeks (around 09/27/2020), or if symptoms worsen or fail to improve.    Marina Gravel, Student-NP

## 2020-09-06 ENCOUNTER — Encounter: Payer: Self-pay | Admitting: *Deleted

## 2020-09-07 ENCOUNTER — Telehealth: Payer: Self-pay | Admitting: Gerontology

## 2020-09-07 NOTE — Telephone Encounter (Signed)
Called patient and left message on his home phone voicemail wanting to make sure that he knew that [employer] transportation is free and will soon run on Saturday, as he has some transportation needs.  Also called the temporary phone number but there was no answer.

## 2020-09-12 NOTE — Telephone Encounter (Signed)
Called patient concerning Social Determinants of Health screening in which transportation was a pressing need to inform patient that [employer] buses are free and are extending service to Saturday.

## 2020-09-14 NOTE — Telephone Encounter (Signed)
Called patient and left message asking him to call clinic.  Wanted to let patient know about free [employer] transportation, as this is a need for patient.

## 2020-09-20 ENCOUNTER — Other Ambulatory Visit: Payer: Self-pay

## 2020-09-27 ENCOUNTER — Ambulatory Visit: Payer: Self-pay | Admitting: Gerontology

## 2020-09-27 ENCOUNTER — Encounter: Payer: Self-pay | Admitting: Gerontology

## 2020-09-27 ENCOUNTER — Other Ambulatory Visit: Payer: Self-pay

## 2020-09-27 DIAGNOSIS — Z0000 Encounter for general adult medical examination without abnormal findings: Secondary | ICD-10-CM

## 2020-09-27 DIAGNOSIS — S90859A Superficial foreign body, unspecified foot, initial encounter: Secondary | ICD-10-CM | POA: Insufficient documentation

## 2020-09-27 DIAGNOSIS — K029 Dental caries, unspecified: Secondary | ICD-10-CM | POA: Insufficient documentation

## 2020-09-27 NOTE — Progress Notes (Signed)
Established Patient Office Visit  Subjective:  Patient ID: Max Brown, male    DOB: [birth year]  Age: 54 y.o. MRN: [id number]  CC: No chief complaint on file.   HPI Max Brown presents for c/o pain to his right heel that started 2 days ago after he steped on an object.He states that he's not experiencing any pain since this morning but it hurts when he walks a lot giving that he is homeless. He denies any erythema, swelling or discharge from the site. He also c/o left lower  Mandibular tooth pain that started 4 days ago , he denies fever, chills, erythema, swelling and jaw pain but requests dental referral. Overall, he states that he's doing well and offers no further complaint.  Past Medical History:  Diagnosis Date  . 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 Diabetes mellitus without complication ([place])   . Hepatitis C    pt states in 2009 diagnosed and received some meds for it, but has not gotten anymore  . Hypertension     No past surgical history on file.  No family history on file.  Social History   Socioeconomic History  . Marital status: Single    Spouse name: Not on file  . Number of children: Not on file  . Years of education: Not on file  . Highest education level: Not on file  Occupational History  . Occupation: unemployed  Tobacco Use  . Smoking status: Current Some Day Smoker    Packs/day: 0.20    Years: 30.00    Pack years: 6.00    Types: Cigarettes  . Smokeless tobacco: Never Used  . Tobacco comment: currently using patches 02/24/2020  Vaping Use  . Vaping Use: Never used  Substance and Sexual Activity  . Alcohol use: Not Currently  . 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 Sexual activity: Not on file  Other Topics Concern  . Not on file  Social History Narrative  . Not on file   Social Determinants of Health   Financial Resource Strain: Low Risk     . Difficulty of Paying Living Expenses: Not hard at all  Food Insecurity: No Food Insecurity  . Worried About Charity fundraiser in the Last Year: Never true  . Ran Out of Food in the Last Year: Never true  Transportation Needs: Unmet Transportation Needs  . Lack of Transportation (Medical): Yes  . Lack of Transportation (Non-Medical): Yes  Physical Activity: Sufficiently Active  . Days of Exercise per Week: 7 days  . Minutes of Exercise per Session: 60 min  Stress: Stress Concern Present  . Feeling of Stress : To some extent  Social Connections: Socially Isolated  . Frequency of Communication with Friends and Family: Twice a week  . Frequency of Social Gatherings with Friends and Family: More than three times a week  . Attends Religious Services: Never  . Active Member of Clubs or Organizations: No  . Attends Archivist Meetings: Never  . Marital Status: Never married  Intimate Partner Violence: Not At Risk  . Fear of Current or Ex-Partner: No  . Emotionally Abused: No  . Physically Abused: No  . Sexually Abused: No    Outpatient Medications Prior to Visit  Medication Sig Dispense Refill  . amLODipine (NORVASC) 5 MG tablet Take 1  tablet (5 mg total) by mouth daily. 30 tablet 3  . ASPIRIN ADULT LOW STRENGTH 81 MG EC tablet Take 1 tablet (81 mg total) by mouth daily. 30 tablet 3  . atorvastatin (LIPITOR) 10 MG tablet Take 1 tablet (10 mg total) by mouth daily. 90 tablet 3  . lisinopril (ZESTRIL) 2.5 MG tablet Take 1 tablet (2.5 mg total) by mouth daily. 30 tablet 3  . metFORMIN (GLUCOPHAGE) 1000 MG tablet Take 1 tablet (1,000 mg total) by mouth 2 (two) times daily. 180 tablet 3  . blood glucose meter kit and supplies KIT Dispense based on patient and insurance preference. Use up to four times daily as directed. (FOR ICD-9 250.00, 250.01). 1 each 0   No facility-administered medications prior to visit.    No Known Allergies  ROS Review of Systems  Constitutional:  Negative.   Respiratory: Negative.   Cardiovascular: Negative.   Musculoskeletal:       Right heel pain after stepping on an object  Neurological: Negative.   Psychiatric/Behavioral: Negative.       Objective:    Physical Exam HENT:     Head: Normocephalic and atraumatic.     Mouth/Throat:     Dentition: Dental caries (left lower manidibular tooth) present.  Cardiovascular:     Rate and Rhythm: Normal rate and regular rhythm.     Pulses: Normal pulses.     Heart sounds: Normal heart sounds.  Pulmonary:     Effort: Pulmonary effort is normal.     Breath sounds: Normal breath sounds.  Skin:    General: Skin is warm and dry.       Neurological:     General: No focal deficit present.     Mental Status: He is alert and oriented to person, place, and time. Mental status is at baseline.  Psychiatric:        Mood and Affect: Mood normal.        Behavior: Behavior normal.        Thought Content: Thought content normal.        Judgment: Judgment normal.     BP (!) 142/86 (BP Location: Left Arm, Patient Position: Sitting, Cuff Size: Large)   Pulse 75   Resp 16   Wt 181 lb 1.6 oz (82.1 kg)   SpO2 98%   BMI 25.99 kg/m  Wt Readings from Last 3 Encounters:  09/27/20 181 lb 1.6 oz (82.1 kg)  08/30/20 172 lb (78 kg)  04/04/20 182 lb (82.6 kg)     Health Maintenance Due  Topic Date Due  . Hepatitis C Screening  Never done  . OPHTHALMOLOGY EXAM  Never done  . HIV Screening  Never done  . COLONOSCOPY  Never done  . INFLUENZA VACCINE  07/16/2020    There are no preventive care reminders to display for this patient.  Lab Results  Component Value Date   TSH 1.440 01/23/2017   Lab Results  Component Value Date   WBC 4.9 11/22/2019   HGB 15.9 11/22/2019   HCT 48.7 11/22/2019   MCV 88.9 11/22/2019   PLT 199 11/22/2019   Lab Results  Component Value Date   NA 141 11/22/2019   K 3.7 11/22/2019   CO2 23 11/22/2019   GLUCOSE 170 (H) 11/22/2019   BUN 18 11/22/2019     CREATININE 0.99 11/22/2019   BILITOT 1.1 11/22/2019   ALKPHOS 100 11/22/2019   AST 19 11/22/2019   ALT 25 11/22/2019   PROT 7.1 11/22/2019  ALBUMIN 3.7 11/22/2019   CALCIUM 9.1 11/22/2019   ANIONGAP 9 11/22/2019   Lab Results  Component Value Date   CHOL 244 (H) 06/21/2020   Lab Results  Component Value Date   HDL 50 06/21/2020   Lab Results  Component Value Date   LDLCALC 181 (H) 06/21/2020   Lab Results  Component Value Date   TRIG 74 06/21/2020   Lab Results  Component Value Date   CHOLHDL 4.9 06/21/2020   Lab Results  Component Value Date   HGBA1C 6.2 (H) 06/21/2020      Assessment & Plan:    1. Health care maintenance -Routine labs will be checked - Urinalysis - CBC w/Diff - Comp Met (CMET) - Lipid panel - HgB A1c  2. Dental decay - He was provided with Dental application and referral was ordered.  3. Splinter of foot without infection, initial encounter - A small dark splinter was removed from his right heel with a forceps, patient tolerated, no erythema nor drainage observed. He was advised to monitor site and notify clinic or go to the ED for erythema, discharge or swelling.    Follow-up: Return in about 1 week (around 10/04/2020), or if symptoms worsen or fail to improve.    Tamma Brigandi Jerold Coombe, NP

## 2020-09-29 LAB — COMPREHENSIVE METABOLIC PANEL
ALT: 15 IU/L (ref 0–44)
AST: 12 IU/L (ref 0–40)
Albumin/Globulin Ratio: 1.8 (ref 1.2–2.2)
Albumin: 4.2 g/dL (ref 3.8–4.9)
Alkaline Phosphatase: 140 IU/L — ABNORMAL HIGH (ref 44–121)
BUN/Creatinine Ratio: 16 (ref 9–20)
BUN: 14 mg/dL (ref 6–24)
Bilirubin Total: 0.3 mg/dL (ref 0.0–1.2)
CO2: 25 mmol/L (ref 20–29)
Calcium: 9.2 mg/dL (ref 8.7–10.2)
Chloride: 108 mmol/L — ABNORMAL HIGH (ref 96–106)
Creatinine, Ser: 0.89 mg/dL (ref 0.76–1.27)
GFR calc Af Amer: 110 mL/min/{1.73_m2} (ref >59)
GFR calc non Af Amer: 96 mL/min/{1.73_m2} (ref >59)
Globulin, Total: 2.3 g/dL (ref 1.5–4.5)
Glucose: 186 mg/dL — ABNORMAL HIGH (ref 65–99)
Potassium: 4.4 mmol/L (ref 3.5–5.2)
Sodium: 146 mmol/L — ABNORMAL HIGH (ref 134–144)
Total Protein: 6.5 g/dL (ref 6.0–8.5)

## 2020-09-29 LAB — CBC WITH DIFFERENTIAL/PLATELET
Basophils Absolute: 0.1 10*3/uL (ref 0.0–0.2)
Basos: 1 %
EOS (ABSOLUTE): 0.1 10*3/uL (ref 0.0–0.4)
Eos: 2 %
Hematocrit: 42.2 % (ref 37.5–51.0)
Hemoglobin: 14 g/dL (ref 13.0–17.7)
Immature Grans (Abs): 0 10*3/uL (ref 0.0–0.1)
Immature Granulocytes: 0 %
Lymphocytes Absolute: 1.8 10*3/uL (ref 0.7–3.1)
Lymphs: 33 %
MCH: 30.2 pg (ref 26.6–33.0)
MCHC: 33.2 g/dL (ref 31.5–35.7)
MCV: 91 fL (ref 79–97)
Monocytes Absolute: 0.4 10*3/uL (ref 0.1–0.9)
Monocytes: 8 %
Neutrophils Absolute: 3.1 10*3/uL (ref 1.4–7.0)
Neutrophils: 56 %
Platelets: 190 10*3/uL (ref 150–450)
RBC: 4.63 x10E6/uL (ref 4.14–5.80)
RDW: 11.9 % (ref 11.6–15.4)
WBC: 5.6 10*3/uL (ref 3.4–10.8)

## 2020-09-29 LAB — LIPID PANEL
Chol/HDL Ratio: 3.3 ratio (ref 0.0–5.0)
Cholesterol, Total: 170 mg/dL (ref 100–199)
HDL: 51 mg/dL (ref >39)
LDL Chol Calc (NIH): 107 mg/dL — ABNORMAL HIGH (ref 0–99)
Triglycerides: 59 mg/dL (ref 0–149)
VLDL Cholesterol Cal: 12 mg/dL (ref 5–40)

## 2020-09-29 LAB — URINALYSIS

## 2020-09-29 LAB — HEMOGLOBIN A1C
Est. average glucose Bld gHb Est-mCnc: 134 mg/dL
Hgb A1c MFr Bld: 6.3 % — ABNORMAL HIGH (ref 4.8–5.6)

## 2020-10-04 ENCOUNTER — Ambulatory Visit: Payer: Self-pay | Admitting: Gerontology

## 2020-10-19 ENCOUNTER — Telehealth: Payer: Self-pay | Admitting: Gerontology

## 2020-10-19 NOTE — Telephone Encounter (Signed)
Called pt on 10/19/20 at 2:30pm to reschedule missed appt, LVM. - CV

## 2020-10-26 ENCOUNTER — Telehealth: Payer: Self-pay

## 2020-10-26 NOTE — Telephone Encounter (Signed)
Called patient to see if he would like to be screened to participate in the [facility] Mind[facility] program.  Left 2 messages for patient and could not leave a message at 2 of the phone numbers called.  One of the phone numbers called was in patient's demographic phone comment information.

## 2020-11-14 ENCOUNTER — Ambulatory Visit: Payer: Self-pay | Admitting: Gerontology

## 2020-11-30 ENCOUNTER — Other Ambulatory Visit: Payer: Self-pay | Admitting: Gerontology

## 2020-11-30 DIAGNOSIS — I10 Essential (primary) hypertension: Secondary | ICD-10-CM

## 2020-12-26 ENCOUNTER — Ambulatory Visit: Payer: Self-pay | Admitting: Gerontology

## 2021-01-09 ENCOUNTER — Ambulatory Visit: Payer: Self-pay | Admitting: Gerontology

## 2021-01-16 ENCOUNTER — Other Ambulatory Visit: Payer: Self-pay | Admitting: Gerontology

## 2021-01-16 DIAGNOSIS — I10 Essential (primary) hypertension: Secondary | ICD-10-CM

## 2021-01-16 DIAGNOSIS — E785 Hyperlipidemia, unspecified: Secondary | ICD-10-CM

## 2021-01-16 MED ORDER — ASPIRIN ADULT LOW STRENGTH 81 MG PO TBEC: 81.0000 mg | DELAYED_RELEASE_TABLET | Freq: Every day | ORAL | 0 refills | Status: DC

## 2021-01-16 MED ORDER — ATORVASTATIN CALCIUM 10 MG PO TABS: 10.0000 mg | ORAL_TABLET | Freq: Every day | ORAL | 0 refills | Status: DC

## 2021-01-17 ENCOUNTER — Telehealth: Payer: Self-pay | Admitting: Gerontology

## 2021-01-17 NOTE — Telephone Encounter (Signed)
Tried to call but call was not able to be completed

## 2021-01-25 ENCOUNTER — Telehealth: Payer: Self-pay | Admitting: Pharmacy Technician

## 2021-01-25 NOTE — Telephone Encounter (Signed)
Received updated proof of income.  Patient eligible to receive medication assistance at [facility] until time for re-certification in 1610, and as long as eligibility requirements continue to be met.  [facility]

## 2021-01-30 ENCOUNTER — Telehealth: Payer: Self-pay | Admitting: Pharmacist

## 2021-01-30 NOTE — Telephone Encounter (Signed)
Provided 2022 proof of income. Approved to receive medication assistance at [facility] until time for recertification in 6837, and as long as eligibility criteria continues to be met.  [place]

## 2021-01-31 ENCOUNTER — Ambulatory Visit: Payer: Self-pay | Admitting: Gerontology

## 2021-02-21 ENCOUNTER — Ambulatory Visit: Payer: Self-pay | Admitting: Gerontology

## 2021-02-27 ENCOUNTER — Telehealth: Payer: Self-pay

## 2021-02-27 NOTE — Telephone Encounter (Signed)
Telephone call made to [facility] to reschedule his appointment and to [facility] to refill prescriptions for Lisinopril and Asprin. Messages left at both. Dayna Barker BSN, RN, [facility] Congregational Nurse [facility]

## 2021-02-28 ENCOUNTER — Other Ambulatory Visit: Payer: Self-pay | Admitting: Gerontology

## 2021-02-28 DIAGNOSIS — I10 Essential (primary) hypertension: Secondary | ICD-10-CM

## 2021-03-15 ENCOUNTER — Ambulatory Visit: Payer: Self-pay | Admitting: Gerontology

## 2021-03-19 ENCOUNTER — Other Ambulatory Visit: Payer: Self-pay | Admitting: Internal Medicine

## 2021-03-19 DIAGNOSIS — M1711 Unilateral primary osteoarthritis, right knee: Secondary | ICD-10-CM

## 2021-03-29 DIAGNOSIS — E119 Type 2 diabetes mellitus without complications: Secondary | ICD-10-CM

## 2021-03-29 LAB — GLUCOSE, POCT (MANUAL RESULT ENTRY): POC Glucose: 177 mg/dl — AB (ref 70–99)

## 2021-03-29 NOTE — Congregational Nurse Program (Signed)
  Dept: [phone]   Congregational Nurse Program Note  Date of Encounter: 03/29/2021 Client in reporting a fall on Tuesday evening, reports neck stiffness and left left rib pain, . No vital signs   Past Medical History: Past Medical History:  Diagnosis Date  . Aneurysm ([facility])    left side of brain, 1997  . Diabetes mellitus without complication ([facility])   . Hepatitis C    pt states in 2009 diagnosed and received some meds for it, but has not gotten anymore  . Hypertension     Encounter Details:  CNP Questionnaire - 03/29/21 1221      Questionnaire   Do you give verbal consent to treat you today? Yes    Visit Setting Church or Organization    Location Patient Served At [facility]    Patient Status Not Applicable    Medical Provider Yes    Housing/Utilities No permanent housing    Transportation Need transportation assistance    Medication Provided medication assistance (Pharmacies, drug rep, etc.)

## 2021-04-03 ENCOUNTER — Other Ambulatory Visit: Payer: Self-pay

## 2021-04-03 ENCOUNTER — Encounter: Payer: Self-pay | Admitting: Gerontology

## 2021-04-03 ENCOUNTER — Ambulatory Visit: Payer: Self-pay | Admitting: Gerontology

## 2021-04-03 VITALS — BP 128/84 | HR 74 | Temp 97.1°F | Resp 16 | Wt 180.3 lb

## 2021-04-03 DIAGNOSIS — R0781 Pleurodynia: Secondary | ICD-10-CM

## 2021-04-03 DIAGNOSIS — I10 Essential (primary) hypertension: Secondary | ICD-10-CM

## 2021-04-03 DIAGNOSIS — E119 Type 2 diabetes mellitus without complications: Secondary | ICD-10-CM

## 2021-04-03 DIAGNOSIS — E785 Hyperlipidemia, unspecified: Secondary | ICD-10-CM

## 2021-04-03 DIAGNOSIS — M25511 Pain in right shoulder: Secondary | ICD-10-CM

## 2021-04-03 DIAGNOSIS — R0789 Other chest pain: Secondary | ICD-10-CM

## 2021-04-03 MED ORDER — ATORVASTATIN CALCIUM 10 MG PO TABS
ORAL_TABLET | Freq: Every day | ORAL | 0 refills | Status: AC
  Filled 2021-04-03: qty 30, 30d supply, fill #0

## 2021-04-03 MED ORDER — LIDOCAINE 5 % EX PTCH
1.0000 | MEDICATED_PATCH | CUTANEOUS | 0 refills | Status: AC
  Filled 2021-04-03: qty 30, 30d supply, fill #0

## 2021-04-03 MED ORDER — METFORMIN HCL 1000 MG PO TABS
1000.0000 mg | ORAL_TABLET | Freq: Two times a day (BID) | ORAL | 3 refills | Status: AC
  Filled 2021-04-03: qty 60, 30d supply, fill #0

## 2021-04-03 NOTE — Patient Instructions (Signed)

## 2021-04-03 NOTE — Progress Notes (Signed)
Established Patient Office Visit  Subjective:  Patient ID: Max Brown, male    DOB: 04/11/1966  Age: 55 y.o. MRN: [id number]  CC: No chief complaint on file.   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 Last Hgb A1c done on 09/27/20 was 6.3%. He states that he is compliant with his medications and continues to make healthy lifestyle changes. He denies any hypo/ hyperglycemic symptoms,peripheral neuropathy and performs daily foot checks. He states that he does not check his blood glucose at home. POCT glucose done today was 166 mg/dl postprandial. Currently, he c/o left sided rib pain and right shoulder pain after he tripped and fell at home last week. He rates the rib pain to his left side at 7/10. He describes pain as constant aching pain that gets worse when he coughs or sneezes. He rates pain to the right shoulder as 6/10, constant aching pain that gets worse with movement. He denies any erythema, swelling or open wound to sites. He states that he is not taking any medication for pain. He states that his right heel is healed from previous wound. He states that his mandibular tooth pain is resolved . He states that he is compliant with his medications. Overall, he states that he is doing well and offers no further complaints.  Past Medical History:  Diagnosis Date  . 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 Diabetes mellitus without complication ([place])   . Hepatitis C    pt states in 2009 diagnosed and received some meds for it, but has not gotten anymore  . Hypertension     No past surgical history on file.  No family history on file.  Social History   Socioeconomic History  . Marital status: Single    Spouse name: Not on file  . Number of children: Not on file  . Years of education: Not on file  . Highest education level: Not on file  Occupational History  . Occupation: unemployed   Tobacco Use  . Smoking status: Current Some Day Smoker    Packs/day: 0.20    Years: 30.00    Pack years: 6.00    Types: Cigarettes  . Smokeless tobacco: Never Used  . Tobacco comment: currently using patches 02/24/2020  Vaping Use  . Vaping Use: Never used  Substance and Sexual Activity  . Alcohol use: Not Currently  . 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 Sexual activity: Not on file  Other Topics Concern  . Not on file  Social History Narrative  . Not on file   Social Determinants of Health   Financial Resource Strain: Low Risk   . Difficulty of Paying Living Expenses: Not hard at all  Food Insecurity: No Food Insecurity  . Worried About Charity fundraiser in the Last Year: Never true  . Ran Out of Food in the Last Year: Never true  Transportation Needs: Unmet Transportation Needs  . Lack of Transportation (Medical): Yes  . Lack of Transportation (Non-Medical): Yes  Physical Activity: Sufficiently Active  . Days of Exercise per Week: 7 days  . Minutes of Exercise per Session: 60 min  Stress: Stress Concern Present  . Feeling of Stress : To some extent  Social  Connections: Socially Isolated  . Frequency of Communication with Friends and Family: Twice a week  . Frequency of Social Gatherings with Friends and Family: More than three times a week  . Attends Religious Services: Never  . Active Member of Clubs or Organizations: No  . Attends Archivist Meetings: Never  . Marital Status: Never married  Intimate Partner Violence: Not At Risk  . Fear of Current or Ex-Partner: No  . Emotionally Abused: No  . Physically Abused: No  . Sexually Abused: No    Outpatient Medications Prior to Visit  Medication Sig Dispense Refill  . amLODipine (NORVASC) 5 MG tablet TAKE ONE TABLET BY MOUTH EVERY DAY 90 tablet 1  . ASPIRIN ADULT LOW STRENGTH 81 MG EC tablet TAKE ONE TABLET BY MOUTH  EVERY DAY 30 tablet 1  . lisinopril (ZESTRIL) 2.5 MG tablet Take 1 tablet (2.5 mg total) by mouth daily. 30 tablet 3  . atorvastatin (LIPITOR) 10 MG tablet TAKE ONE TABLET BY MOUTH EVERY DAY 30 tablet 0  . lisinopril (ZESTRIL) 5 MG tablet TAKE 1/2 TABLET BY MOUTH EVERY DAY 15 tablet 3  . metFORMIN (GLUCOPHAGE) 1000 MG tablet Take 1 tablet (1,000 mg total) by mouth 2 (two) times daily. 180 tablet 3  . blood glucose meter kit and supplies KIT Dispense based on patient and insurance preference. Use up to four times daily as directed. (FOR ICD-9 250.00, 250.01). 1 each 0   No facility-administered medications prior to visit.    No Known Allergies  ROS Review of Systems  Constitutional: Negative.   Respiratory: Negative.   Cardiovascular: Negative.   Gastrointestinal: Negative.   Endocrine: Negative.   Genitourinary: Negative.   Skin: Negative.   Neurological: Negative.   Psychiatric/Behavioral: Negative.       Objective:    Physical Exam HENT:     Head: Normocephalic.  Cardiovascular:     Rate and Rhythm: Normal rate and regular rhythm.     Pulses: Normal pulses.     Heart sounds: Normal heart sounds.  Pulmonary:     Effort: Pulmonary effort is normal.     Breath sounds: Normal breath sounds.  Abdominal:     General: Abdomen is flat. Bowel sounds are normal.     Palpations: Abdomen is soft.  Musculoskeletal:        General: Tenderness present. No swelling or deformity.       Arms:  Skin:    General: Skin is warm and dry.     Findings: No bruising or erythema.  Neurological:     Mental Status: He is alert and oriented to person, place, and time.  Psychiatric:        Mood and Affect: Mood normal.     BP 128/84 (BP Location: Right Arm, Patient Position: Sitting, Cuff Size: Large)   Pulse 74   Temp (!) 97.1 F (36.2 C)   Resp 16   Wt 180 lb 4.8 oz (81.8 kg)   SpO2 97%   BMI 25.87 kg/m  Wt Readings from Last 3 Encounters:  04/03/21 180 lb 4.8 oz (81.8 kg)   09/27/20 181 lb 1.6 oz (82.1 kg)  08/30/20 172 lb (78 kg)     Health Maintenance Due  Topic Date Due  . Hepatitis C Screening  Never done  . OPHTHALMOLOGY EXAM  Never done  . HIV Screening  Never done  . COLONOSCOPY (Pts 45-27yr Insurance coverage will need to be confirmed)  Never done  . COVID-19 Vaccine (2 -  Booster for [employer] series) 04/27/2020  . FOOT EXAM  01/12/2021  . HEMOGLOBIN A1C  03/28/2021    There are no preventive care reminders to display for this patient.  Lab Results  Component Value Date   TSH 1.440 01/23/2017   Lab Results  Component Value Date   WBC 5.6 09/27/2020   HGB 14.0 09/27/2020   HCT 42.2 09/27/2020   MCV 91 09/27/2020   PLT 190 09/27/2020   Lab Results  Component Value Date   NA 146 (H) 09/27/2020   K 4.4 09/27/2020   CO2 25 09/27/2020   GLUCOSE 186 (H) 09/27/2020   BUN 14 09/27/2020   CREATININE 0.89 09/27/2020   BILITOT 0.3 09/27/2020   ALKPHOS 140 (H) 09/27/2020   AST 12 09/27/2020   ALT 15 09/27/2020   PROT 6.5 09/27/2020   ALBUMIN 4.2 09/27/2020   CALCIUM 9.2 09/27/2020   ANIONGAP 9 11/22/2019   Lab Results  Component Value Date   CHOL 170 09/27/2020   Lab Results  Component Value Date   HDL 51 09/27/2020   Lab Results  Component Value Date   LDLCALC 107 (H) 09/27/2020   Lab Results  Component Value Date   TRIG 59 09/27/2020   Lab Results  Component Value Date   CHOLHDL 3.3 09/27/2020   Lab Results  Component Value Date   HGBA1C 6.3 (H) 09/27/2020      Assessment & Plan:   1. Essential hypertension -His blood pressure is under control, will continue on current treatment regimen -He will continue on Dash diet  2. Type 2 diabetes mellitus without complication, without long-term current use of insulin ([place]) -He will continue on Low carb and non concentrated sweets diet. Continue on the current treatment regimen. - HgB A1c; Future - metFORMIN (GLUCOPHAGE) 1000 MG tablet; Take 1 tablet (1,000 mg total)  by mouth 2 (two) times daily.  Dispense: 60 tablet; Refill: 3  3. Acute pain of right shoulder -He will continue to observe, use Lidocaine patch and go to the ED with worsening symptoms. - lidocaine (LIDODERM) 5 %; Place 1 patch onto the skin daily. Remove & Discard patch within 12 hours or as directed by MD  Dispense: 30 patch; Refill: 0  4. Rib pain on left side -He will use lidocaine patch and he was advised to go to the ED with worsening symptoms. - lidocaine (LIDODERM) 5 %; Place 1 patch onto the skin daily. Remove & Discard patch within 12 hours or as directed by MD  Dispense: 30 patch; Refill: 0  5. Elevated lipids He will continue on low fat/ low cholesterol diet and exercise as tolerated. - atorvastatin (LIPITOR) 10 MG tablet; TAKE ONE TABLET BY MOUTH EVERY DAY  Dispense: 30 tablet; Refill: 0    Follow-up: Return in about 1 month (around 05/03/2021), or if symptoms worsen or fail to improve.    Danella Maiers, RN

## 2021-04-04 ENCOUNTER — Other Ambulatory Visit: Payer: Self-pay

## 2021-04-11 ENCOUNTER — Other Ambulatory Visit: Payer: Self-pay

## 2021-04-11 DIAGNOSIS — E119 Type 2 diabetes mellitus without complications: Secondary | ICD-10-CM

## 2021-04-12 ENCOUNTER — Telehealth: Payer: Self-pay

## 2021-04-12 ENCOUNTER — Other Ambulatory Visit: Payer: Self-pay | Admitting: Gerontology

## 2021-04-12 DIAGNOSIS — R0781 Pleurodynia: Secondary | ICD-10-CM

## 2021-04-12 LAB — HEMOGLOBIN A1C
Est. average glucose Bld gHb Est-mCnc: 157 mg/dL
Hgb A1c MFr Bld: 7.1 % — ABNORMAL HIGH (ref 4.8–5.6)

## 2021-04-12 NOTE — Telephone Encounter (Signed)
Tried calling pt on both numbers listed but was unable to go through. Trying to notify pt that his chect xray has been ordered so he can walk into imaging at [facility] at any time.

## 2021-04-19 ENCOUNTER — Ambulatory Visit: Payer: Self-pay | Admitting: Gerontology

## 2021-04-26 ENCOUNTER — Ambulatory Visit: Payer: Self-pay | Admitting: Gerontology

## 2021-05-03 ENCOUNTER — Ambulatory Visit: Payer: Self-pay | Admitting: Gerontology

## 2021-05-04 ENCOUNTER — Other Ambulatory Visit: Payer: Self-pay

## 2021-06-13 ENCOUNTER — Other Ambulatory Visit: Payer: Self-pay

## 2021-06-13 ENCOUNTER — Other Ambulatory Visit: Payer: Self-pay | Admitting: Gerontology

## 2021-06-13 DIAGNOSIS — E785 Hyperlipidemia, unspecified: Secondary | ICD-10-CM

## 2021-06-13 DIAGNOSIS — E119 Type 2 diabetes mellitus without complications: Secondary | ICD-10-CM

## 2021-06-20 ENCOUNTER — Ambulatory Visit: Payer: Self-pay | Admitting: Gerontology

## 2021-06-27 ENCOUNTER — Other Ambulatory Visit: Payer: Self-pay

## 2021-07-03 ENCOUNTER — Other Ambulatory Visit: Payer: Self-pay

## 2021-08-15 ENCOUNTER — Other Ambulatory Visit: Payer: Self-pay

## 2022-04-18 ENCOUNTER — Other Ambulatory Visit: Payer: Self-pay

## 2023-05-06 ENCOUNTER — Other Ambulatory Visit: Payer: Self-pay
